# Patient Record
Sex: Female | Born: 1935 | Race: White | Hispanic: No | Smoking: Former smoker
Health system: Southern US, Community
[De-identification: ages and names within clinical notes are randomized; demographics above are authoritative.]

## PROBLEM LIST (undated history)

## (undated) DIAGNOSIS — K219 Gastro-esophageal reflux disease without esophagitis: Secondary | ICD-10-CM

## (undated) DIAGNOSIS — R251 Tremor, unspecified: Secondary | ICD-10-CM

## (undated) DIAGNOSIS — E039 Hypothyroidism, unspecified: Secondary | ICD-10-CM

## (undated) DIAGNOSIS — S82899A Other fracture of unspecified lower leg, initial encounter for closed fracture: Secondary | ICD-10-CM

## (undated) DIAGNOSIS — G4700 Insomnia, unspecified: Secondary | ICD-10-CM

## (undated) DIAGNOSIS — M545 Low back pain, unspecified: Secondary | ICD-10-CM

## (undated) DIAGNOSIS — R269 Unspecified abnormalities of gait and mobility: Secondary | ICD-10-CM

## (undated) DIAGNOSIS — G2581 Restless legs syndrome: Secondary | ICD-10-CM

## (undated) DIAGNOSIS — M1990 Unspecified osteoarthritis, unspecified site: Secondary | ICD-10-CM

## (undated) DIAGNOSIS — N6310 Unspecified lump in the right breast, unspecified quadrant: Secondary | ICD-10-CM

## (undated) DIAGNOSIS — F32A Depression, unspecified: Secondary | ICD-10-CM

## (undated) DIAGNOSIS — D693 Immune thrombocytopenic purpura: Secondary | ICD-10-CM

## (undated) DIAGNOSIS — D492 Neoplasm of unspecified behavior of bone, soft tissue, and skin: Secondary | ICD-10-CM

## (undated) DIAGNOSIS — G8929 Other chronic pain: Secondary | ICD-10-CM

## (undated) DIAGNOSIS — Z9981 Dependence on supplemental oxygen: Secondary | ICD-10-CM

## (undated) DIAGNOSIS — I639 Cerebral infarction, unspecified: Secondary | ICD-10-CM

## (undated) DIAGNOSIS — R61 Generalized hyperhidrosis: Secondary | ICD-10-CM

## (undated) DIAGNOSIS — G3184 Mild cognitive impairment, so stated: Secondary | ICD-10-CM

## (undated) DIAGNOSIS — I10 Essential (primary) hypertension: Secondary | ICD-10-CM

## (undated) DIAGNOSIS — L719 Rosacea, unspecified: Secondary | ICD-10-CM

## (undated) DIAGNOSIS — J449 Chronic obstructive pulmonary disease, unspecified: Secondary | ICD-10-CM

## (undated) DIAGNOSIS — F419 Anxiety disorder, unspecified: Secondary | ICD-10-CM

## (undated) DIAGNOSIS — R42 Dizziness and giddiness: Secondary | ICD-10-CM

## (undated) DIAGNOSIS — F329 Major depressive disorder, single episode, unspecified: Secondary | ICD-10-CM

## (undated) DIAGNOSIS — E785 Hyperlipidemia, unspecified: Secondary | ICD-10-CM

## (undated) DIAGNOSIS — I219 Acute myocardial infarction, unspecified: Secondary | ICD-10-CM

## (undated) DIAGNOSIS — I2510 Atherosclerotic heart disease of native coronary artery without angina pectoris: Secondary | ICD-10-CM

## (undated) DIAGNOSIS — J189 Pneumonia, unspecified organism: Secondary | ICD-10-CM

## (undated) DIAGNOSIS — E538 Deficiency of other specified B group vitamins: Secondary | ICD-10-CM

## (undated) HISTORY — DX: Unspecified osteoarthritis, unspecified site: M19.90

## (undated) HISTORY — DX: Neoplasm of unspecified behavior of bone, soft tissue, and skin: D49.2

## (undated) HISTORY — DX: Generalized hyperhidrosis: R61

## (undated) HISTORY — DX: Chronic obstructive pulmonary disease, unspecified: J44.9

## (undated) HISTORY — DX: Tremor, unspecified: R25.1

## (undated) HISTORY — DX: Major depressive disorder, single episode, unspecified: F32.9

## (undated) HISTORY — DX: Acute myocardial infarction, unspecified: I21.9

## (undated) HISTORY — DX: Dizziness and giddiness: R42

## (undated) HISTORY — DX: Immune thrombocytopenic purpura: D69.3

## (undated) HISTORY — DX: Unspecified lump in the right breast, unspecified quadrant: N63.10

## (undated) HISTORY — DX: Other fracture of unspecified lower leg, initial encounter for closed fracture: S82.899A

## (undated) HISTORY — DX: Rosacea, unspecified: L71.9

## (undated) HISTORY — DX: Cerebral infarction, unspecified: I63.9

## (undated) HISTORY — DX: Essential (primary) hypertension: I10

## (undated) HISTORY — DX: Deficiency of other specified B group vitamins: E53.8

## (undated) HISTORY — DX: Restless legs syndrome: G25.81

## (undated) HISTORY — DX: Hyperlipidemia, unspecified: E78.5

## (undated) HISTORY — PX: SHOULDER OPEN ROTATOR CUFF REPAIR: SHX2407

## (undated) HISTORY — PX: TONSILLECTOMY: SUR1361

## (undated) HISTORY — DX: Gastro-esophageal reflux disease without esophagitis: K21.9

## (undated) HISTORY — DX: Hypothyroidism, unspecified: E03.9

## (undated) HISTORY — DX: Insomnia, unspecified: G47.00

## (undated) HISTORY — DX: Depression, unspecified: F32.A

---

## 1967-08-25 HISTORY — PX: TOTAL ABDOMINAL HYSTERECTOMY: SHX209

## 2003-01-08 ENCOUNTER — Encounter: Payer: Self-pay | Admitting: Orthopaedic Surgery

## 2003-01-08 ENCOUNTER — Encounter: Admission: RE | Admit: 2003-01-08 | Discharge: 2003-01-08 | Payer: Self-pay | Admitting: Orthopaedic Surgery

## 2003-01-11 ENCOUNTER — Ambulatory Visit (HOSPITAL_BASED_OUTPATIENT_CLINIC_OR_DEPARTMENT_OTHER): Admission: RE | Admit: 2003-01-11 | Discharge: 2003-01-12 | Payer: Self-pay | Admitting: Orthopaedic Surgery

## 2003-08-02 ENCOUNTER — Ambulatory Visit (HOSPITAL_BASED_OUTPATIENT_CLINIC_OR_DEPARTMENT_OTHER): Admission: RE | Admit: 2003-08-02 | Discharge: 2003-08-02 | Payer: Self-pay | Admitting: Orthopaedic Surgery

## 2005-01-30 ENCOUNTER — Encounter: Admission: RE | Admit: 2005-01-30 | Discharge: 2005-01-30 | Payer: Self-pay | Admitting: Unknown Physician Specialty

## 2005-08-27 ENCOUNTER — Ambulatory Visit: Payer: Self-pay | Admitting: Cardiology

## 2005-09-30 ENCOUNTER — Ambulatory Visit: Payer: Self-pay | Admitting: Cardiology

## 2006-01-28 ENCOUNTER — Encounter: Admission: RE | Admit: 2006-01-28 | Discharge: 2006-01-28 | Payer: Self-pay | Admitting: Orthopaedic Surgery

## 2006-02-17 ENCOUNTER — Encounter: Admission: RE | Admit: 2006-02-17 | Discharge: 2006-02-17 | Payer: Self-pay | Admitting: Orthopaedic Surgery

## 2006-03-11 ENCOUNTER — Encounter: Admission: RE | Admit: 2006-03-11 | Discharge: 2006-03-11 | Payer: Self-pay | Admitting: Orthopaedic Surgery

## 2006-06-02 ENCOUNTER — Encounter: Admission: RE | Admit: 2006-06-02 | Discharge: 2006-06-02 | Payer: Self-pay | Admitting: Orthopaedic Surgery

## 2006-09-06 ENCOUNTER — Encounter: Admission: RE | Admit: 2006-09-06 | Discharge: 2006-09-06 | Payer: Self-pay | Admitting: Orthopaedic Surgery

## 2006-09-20 ENCOUNTER — Encounter: Admission: RE | Admit: 2006-09-20 | Discharge: 2006-09-20 | Payer: Self-pay | Admitting: Orthopaedic Surgery

## 2006-10-15 ENCOUNTER — Ambulatory Visit: Payer: Self-pay | Admitting: Cardiology

## 2007-02-16 ENCOUNTER — Encounter: Admission: RE | Admit: 2007-02-16 | Discharge: 2007-02-16 | Payer: Self-pay | Admitting: Orthopaedic Surgery

## 2007-04-08 ENCOUNTER — Encounter: Admission: RE | Admit: 2007-04-08 | Discharge: 2007-04-08 | Payer: Self-pay | Admitting: Orthopaedic Surgery

## 2007-04-26 ENCOUNTER — Encounter: Admission: RE | Admit: 2007-04-26 | Discharge: 2007-04-26 | Payer: Self-pay | Admitting: Orthopaedic Surgery

## 2007-06-10 ENCOUNTER — Encounter: Admission: RE | Admit: 2007-06-10 | Discharge: 2007-06-10 | Payer: Self-pay | Admitting: Orthopaedic Surgery

## 2008-09-20 ENCOUNTER — Ambulatory Visit (HOSPITAL_BASED_OUTPATIENT_CLINIC_OR_DEPARTMENT_OTHER): Admission: RE | Admit: 2008-09-20 | Discharge: 2008-09-21 | Payer: Self-pay | Admitting: Orthopaedic Surgery

## 2010-12-08 LAB — POCT I-STAT, CHEM 8
BUN: 29 mg/dL — ABNORMAL HIGH (ref 6–23)
HCT: 37 % (ref 36.0–46.0)
Hemoglobin: 12.6 g/dL (ref 12.0–15.0)
Potassium: 4.2 mEq/L (ref 3.5–5.1)
TCO2: 26 mmol/L (ref 0–100)

## 2011-01-06 NOTE — Op Note (Signed)
NAMEMADGE, THERRIEN             ACCOUNT NO.:  [id number]   MEDICAL RECORD NO.:  [id number]          PATIENT TYPE:  AMB   LOCATION:  DSC                          FACILITY:  [facility]   PHYSICIAN:  Claude Manges. Whitfield, M.D.DATE OF BIRTH:  03/06/1925   DATE OF PROCEDURE:  DATE OF DISCHARGE:                               OPERATIVE REPORT   PREOPERATIVE DIAGNOSES:  1. Rotator cuff tear with impingement, left shoulder.  2. Degenerative joint disease, acromioclavicular joint.   POSTOPERATIVE DIAGNOSES:  1. Rotator cuff tear with impingement left shoulder.  2. Degenerative joint disease, acromioclavicular joint.  3. Biceps tendon tear.   PROCEDURES:  1. Diagnostic arthroscopy, left shoulder with debridement.  2. Arthroscopic subacromial decompression.  3. Arthroscopic distal clavicle resection.  4. Mini-open rotator cuff tear repair with biceps tenodesis.   SURGEON:  Claude Manges. Cleophas Dunker, MD   ASSISTANT:  Karie Chimera, PA-C   ANESTHESIA:  General with supplemental interscalene nerve block.   COMPLICATIONS:  None.   HISTORY:  An 77 year old female who has been seen at intervals in  regards to left shoulder pain.  She has had trouble for well over a year  particularly with overhead motion.  She has recently developed more pain  to that point of compromise with difficulty sleeping.  She has had an  MRI scan performed a year and half ago revealing moderate degenerative  change at the [facility] joint, areas of tearing of the supraspinatus tendon and  now she is to have an arthroscopic evaluation with rotator cuff tear  repair.   PROCEDURE:  With the patient comfortable on operating table and under  general orotracheal anesthesia, the patient was placed in semi-sitting  position with a shoulder frame.  She had a preoperative interscalene  nerve block with excellent anesthesia to her arm.   The left shoulder was then evaluated without evidence of adhesive  capsulitis or subluxation.   The  left shoulder was then prepped with DuraPrep and the base of the  neck circumferentially, and below the elbow, a sterile draping was  performed.   Marking pen was used to outline the [facility] joint, the coracoid, and the  acromion.  At a point, a fingerbreadth posterior and medial to the  posterior angle of acromion, a small stab wound was made.  The  arthroscope easily placed in the shoulder joint.  Diagnostic arthroscopy  revealed diffuse synovitis.  There was an obvious tear of the biceps  tendon about a cm from its attachment of the superior glenoid.  There  was no subluxation.  The tear was over 50% of the tendon.  A second  portal was established anteriorly and the cannula inserted.  Debridement  of the synovitis was performed.  I did find a small tear of the glenoid  labrum, which I debrided and resected the synovitis.   The arthroscope was then placed at subacromial space posteriorly, the  cannula at subacromial space anteriorly and a third portal was  established on lateral subacromial space.  An arthroscopic subacromial  decompression was performed, removing abundant bursal tissue.  There was  obvious overhang of the anterior  and lateral acromion.  An anterolateral  acromioplasty was performed with a 6 mm bur, had a very nice resection.  The distal clavicle was visible.  There was loss of articular cartilage  and was quite mobile.  I performed the distal clavicle resection with  the 6 mm bur.  I could visualize the rotator cuff tear both from bursal  and the joint surface.   A mini-open rotator cuff tear repair was performed about 1 to 1-1/2 inch  incision was made over the anterior aspect of the shoulder while sharp  dissection carried out at the subcutaneous tissue, gross bleeders bovie  coagulated.  Deltoid fascia was then incised and the subacromial space  entered.  A self-retaining retractor was inserted.  Rotator cuff was  evaluated, there was a longitudinal tear in the  supraspinatus just  medial to the biceps tendon, but there was no retraction of the cuff.  The edges were sharply debrided.  The biceps tendon was identified, it  was tagged and then incised from its attachment to the superior aspect  of the glenoid.  Biceps tenodesis was performed using the 5.5 mm  [employer] anchor within its groove.  Extraneous tendon was then  excised.   The cuff tear was then easily repaired from its superior aspect through  the biceps canal with interrupted 0 Ethibond, had a very nice repair.  The wound was irrigated with saline solution.  The deltoid fascia closed  with a running 0 Vicryl, subcu with 2-0 Vicryl.  Skin closed with the  Steri-Strips and with benzoin.  A sterile bulky dressing was applied  followed by a sling.   PLAN:  Recovery care, Vicodin for pain, office in 1 week.      Claude Manges. Cleophas Dunker, M.D.  Electronically Signed     PWW/MEDQ  D:  09/20/2008  T:  09/21/2008  Job:  [id number]

## 2011-01-09 NOTE — Op Note (Signed)
Tamara Hart, Tamara Hart                         ACCOUNT NO.:  [id number]   MEDICAL RECORD NO.:  [id number]                   PATIENT TYPE:  AMB   LOCATION:  DSC                                  FACILITY:  [facility]   PHYSICIAN:  Claude Manges. Cleophas Dunker, M.D.            DATE OF BIRTH:  August 06, 1925   DATE OF PROCEDURE:  08/02/2003  DATE OF DISCHARGE:                                 OPERATIVE REPORT   PREOPERATIVE DIAGNOSIS:  Recurrent rotator cuff tear, right shoulder.   POSTOPERATIVE DIAGNOSIS:  Recurrent rotator cuff tear, right shoulder.   PROCEDURE:  Rotator cuff tear repair with supplemental Dupuy Restore patch.   SURGEON:  Claude Manges. Cleophas Dunker, M.D.   ASSISTANT:  Legrand Pitts. Duffy, P.A.   ANESTHESIA:  General orotracheal with supplemental interscalene block.   COMPLICATIONS:  None.   INDICATIONS FOR PROCEDURE:  This 77 year old female is probably seven or  eight months post rotator cuff tear repair with a distal clavicle resection  and open acromioplasty and she did very well initially only to have a fall  approximately 6 to 7 weeks postoperatively.  She has some trouble every  since that time, seems to have progressed, and accordingly a second MRI scan  was performed revealing a recurrent tear of the supraspinatus.  She is  uncomfortable and therefore, she will have an exploration of the shoulder  and then repair of the recurrent tear.  There is no evidence of impingement.   DESCRIPTION OF PROCEDURE:  With the patient comfortable on the operating  table and under general orotracheal anesthesia with a supplemental  interscalene block, the patient was placed in a semisitting position with  the shoulder frame. The right shoulder was then prepped in the base of the  neck circumferentially to below the elbow.  Sterile draping was performed.   The previous mini incision at the junction of the anterior and lateral  aspects of the shoulder was utilized.  The 1-1/2 inch incision was incised  sharply through fascia.  Deltoid fascia was identified and incised the  raphe.  By blunt dissection, the deltoid muscle was separated.  Self-  retaining retractor was inserted.  Subdeltoid bursa was identified and  excised.  The rotator cuff and the subacromial space was then easily  identified.  There was a tear over the rotator cuff.  It was longitudinal in  type, extending from the attachment to the greater tuberosity, almost to the  glenoid rim superiorly.  The edges were rounded and it was sharply debrided.  The biceps tendon was intact.  The rotator cuff was diffusely atrophic.  I  was able to repair the edges with 0 Tycron suture after sharply debriding  them.  I then felt that a Restore patch was worthwhile just to supplement  the repair and hopefully prevent further tearing.  Accordingly, the patch  was soaked in sterile saline for 10 minutes and then repaired with 2-0  Ethibond under  tension.   The wound was then copiously irrigated with saline solution.  There was no  evidence of any impingement of the acromial edge either laterally or  anteriorly.   The wound was again irrigated with saline solution and the deltoid fascia  closed with a running 2-0 Vicryl, the subcu closed with 2-0 Vicryl, the skin  was closed with skin clips.  A sterile bulky dressing was applied followed  by a sling.   PLAN:  Recovery care.  Mepergan Fortis for pain.  Office one week.                                               Claude Manges. Cleophas Dunker, M.D.    PWW/MEDQ  D:  08/02/2003  T:  08/03/2003  Job:  [id number]

## 2011-01-09 NOTE — Op Note (Signed)
Tamara Hart, Tamara Hart                         ACCOUNT NO.:  [id number]   MEDICAL RECORD NO.:  [id number]                   PATIENT TYPE:  AMB   LOCATION:  DSC                                  FACILITY:  [facility]   PHYSICIAN:  Claude Manges. Cleophas Dunker, M.D.            DATE OF BIRTH:  12/27/1924   DATE OF PROCEDURE:  DATE OF DISCHARGE:                                 OPERATIVE REPORT   PREOPERATIVE DIAGNOSES:  1. Rotator cuff tear with impingement of the right shoulder.  2. Degenerative joint disease, acromioclavicular joint.   POSTOPERATIVE DIAGNOSES:  1. Rotator cuff tear with impingement of the right shoulder.  2. Degenerative joint disease, acromioclavicular joint.   PROCEDURE:  1. Arthroscopic subacromial decompression.  2. Arthroscopic-assisted clavicle resection.  3. Mini open rotator cuff tear repair.   SURGEON:  Claude Manges. Cleophas Dunker, M.D.   ASSISTANT:  Trina Ao, P.A.-C.   ANESTHESIA:  General tracheal.   COMPLICATIONS:  None.   HISTORY:  The patient is a 77 year old female who sustained a rotator cuff  tear in early January of this year when she was simply using my shoulder.  She had seen other orthopedists in [place], with an MRI scan consistent with  rotator cuff tear.  She came to [facility] for reevaluation with little if  any discomfort in early April.  The surgery was postponed, but because of  her persistent discomfort over the past several weeks, she preferred to  proceed with the surgery.  She was found to have rotator cuff tear repair  with acromioplasty and distal clavicle resection.   PROCEDURE:  The patient was comfortable on the operating table.  Under  general endotracheal anesthesia and supplemental under scalene block, the  patient was placed in a semi-sitting position, with a shoulder __________ .  The right shoulder was then prepped circumferentially from the base of the  neck to the mid forearm with DuraPrep, and sterile draping was performed.   A  marking pen was used to outline the acromion, the [facility] joint, and the  coracoid.  At a point, a fingerbreadth inferior and medial to the posterior  angle of the acromion, a small stable wound was made.  Arthroscope was  easily placed into the joint.  I did not see any appreciable chondromalacia  of the humeral head or the glenoid.  I did not see any tearing of the  labrum.  The biceps was intact.  There was an obvious old tear of the  rotator cuff with retraction and rounded edges.   The arthroscope was then placed in the subacromial space.  A second port  hole was established anteriorly, and a third in the lateral subacromial  space, and arthroscopic subacromial decompression was performed with the  ArthroCare wand, the intraarticular shaver, and the 6 mm bur with a very  nice decompression anteriorly and laterally as there were overhanging  osteophytes.   A distal clavicle resection was  also performed through the scope.   A mini-open procedure was then performed through the rotator cuff tear.  About 1-1/2 inch incision was made at the junction of the anterior and  lateral aspects of the shoulder, and very sharp dissection was carried down  to the subcutaneous tissues, involving the use of Bovie coagulating.  __________  within the deltoid muscle was identified, incised, and then  retracted to visualize the rotator cuff.  The edges were rounded and  retracted.  These were retrieved.  The edges were sharply debrided, and the  cuff was repaired end-to-end beginning posteriorly, in a V-fashion extending  to the anterior aspect of the shoulder.  Anchoring was not necessary.   We had a nice repair of the cuff.  We then placed into a range of motion,  and it remained stable with good coverage.   The joint was irrigated.  The deltoid muscle was closed anatomically with a  running 0-Vicryl, and the subcutaneous with a 2-0 Vicryl. The skin was  closed with skin clips.  Sterile, bulky dressing  was applied, followed by a  sling.   PLAN:  1. Recovery care.  2. Discharge in the a.m. with Percocet.  3. Return to office in one week.                                               Claude Manges. Cleophas Dunker, M.D.    PWW/MEDQ  D:  01/11/2003  T:  01/11/2003  Job:  [id number]

## 2011-04-07 ENCOUNTER — Encounter: Payer: Self-pay | Admitting: *Deleted

## 2011-04-07 ENCOUNTER — Ambulatory Visit (INDEPENDENT_AMBULATORY_CARE_PROVIDER_SITE_OTHER): Payer: Medicare Other | Admitting: Pulmonary Disease

## 2011-04-07 VITALS — BP 132/74 | HR 60 | Temp 98.6°F | Ht 61.0 in | Wt 126.8 lb

## 2011-04-07 DIAGNOSIS — R0600 Dyspnea, unspecified: Secondary | ICD-10-CM | POA: Insufficient documentation

## 2011-04-07 DIAGNOSIS — R0609 Other forms of dyspnea: Secondary | ICD-10-CM

## 2011-04-07 MED ORDER — TIOTROPIUM BROMIDE MONOHYDRATE 18 MCG IN CAPS: 18.0000 ug | ORAL_CAPSULE | Freq: Every day | RESPIRATORY_TRACT | Status: DC

## 2011-04-07 NOTE — Patient Instructions (Signed)
Spiriva one puff daily Continue advair one puff twice per day Follow up in 4 weeks

## 2011-04-07 NOTE — Progress Notes (Signed)
Subjective:    Patient ID: Tamara Hart, female    DOB: [birth year], 77 y.o.   MRN: [id number]  HPI  77 yo female former smoker with dyspnea.  She has noticed more trouble with her breathing for the past 8 months.  She used to smoke, and has significant 2nd hand tobacco exposure from her husband.  She was told that she had emphysema after a breathing test one year ago.  She has been getting winded when talking or with minimal activity.  She also has been doing purse-lip breathing with activity.  She is not aware of anything that could have triggered her worsening dyspnea.    She gets a dry cough.  She denies wheeze, chest tightness, chest pain, hemoptysis.  She has not had sinus problems or post-nasal drip.  She is not aware of having allergies.  She does get a raspy throat and has been sneezing in the morning.  She sometimes feels like something is stuck in her throat.  She was started recently on advair, and this has helped.    The patient denies any history of pneumonia or tuberculosis.  There is no recent travel history.  The patient denies any significant occupational exposures.  The patient denies any animal exposures.  Past Medical History  Diagnosis Date  . COPD (chronic obstructive pulmonary disease)   . DJD (degenerative joint disease)   . Mass of breast, right   . Skin neoplasm   . Hypertension   . Vertigo   . Restless legs   . Hypothyroidism   . Shortness of breath   . Esophageal reflux   . Hyperlipidemia   . Hyperhidrosis   . Insomnia   . Depressive disorder   . Pure hypercholesterolemia   . Vitamin B 12 deficiency   . Allergic rhinitis   . Stroke   . Idiopathic thrombocytopenia purpura   . Rosacea      Family History  Problem Relation Age of Onset  . Heart disease Mother   . Heart disease Father      History   Social History  . Marital Status: Married    Spouse Name: N/A    Number of Children: N/A  . Years of Education: N/A   Occupational  History  . retired    Social History Main Topics  . Smoking status: Former Smoker -- 1.0 packs/day for 3 years    Quit date: 08/24/1996  . Smokeless tobacco: Not on file  . Alcohol Use: Not on file  . Drug Use: Not on file  . Sexually Active: Not on file   Other Topics Concern  . Not on file   Social History Narrative  . No narrative on file     Allergies  Allergen Reactions  . Codeine     Review of Systems  HENT: Positive for trouble swallowing.   Respiratory: Positive for cough and shortness of breath.   Musculoskeletal: Positive for joint swelling.      Objective:   Physical Exam BP 132/74  Pulse 60  Temp(Src) 98.6 F (37 C) (Oral)  Ht 5\' 1"  (1.549 m)  Wt 126 lb 12.8 oz (57.516 kg)  BMI 23.96 kg/m2  SpO2 97%  General - thin HEENT - PERRLA, EOMI, no sinus tenderness, no oral exudate, no LAN, no thyromegaly Chest - diminished breath sounds, no wheeze/rales/dullness Cardiac - s1s2 regular, no murmur, pulses symmetric Abd - soft, nontender, no organomegaly, normal bowel sounds Ext - no e/c/c Neuro - normal strenght, CN  intact Psych - normal mood/behavior Skin - no rashes  Chest xray 03/12/11>>no active disease process.  Spirometry attempted, but she had difficulty with test maneuver.     Assessment & Plan:   Dyspnea She has extensive prior history of smoking.  PFT from 2011 showed diffusion defect.  Her symptoms are suggestive of obstructive lung disease.  She had difficulty doing spirometry today.  She has noticed symptomatic improvement with inhaler therapy.  I will continue her with advair, and add spiriva one puff daily.  Depending on her response will determine if additional interventions are needed.    Updated Medication List Outpatient Encounter Prescriptions as of 04/07/2011  Medication Sig Dispense Refill  . albuterol (VENTOLIN HFA) 108 (90 BASE) MCG/ACT inhaler Inhale 2 puffs into the lungs every 6 (six) hours as needed.        . ALPRAZolam  (XANAX) 0.5 MG tablet Take 0.5 mg by mouth 2 (two) times daily as needed.        Marland Kitchen aspirin 325 MG tablet Take 325 mg by mouth daily.        . Calcium Carb-Cholecalciferol (CALCIUM 500 +D) 500-400 MG-UNIT TABS 1 tablet twice a day       . carbidopa-levodopa (SINEMET) 25-100 MG per tablet Take 1 tablet by mouth 2 (two) times daily.        . citalopram (CELEXA) 20 MG tablet Take 20 mg by mouth daily.        . diazepam (VALIUM) 2 MG tablet Take 2 mg by mouth every 6 (six) hours as needed.        . fluticasone-salmeterol (ADVAIR HFA) 115-21 MCG/ACT inhaler Inhale 2 puffs into the lungs 2 (two) times daily.        Marland Kitchen glycopyrrolate (ROBINUL) 1 MG tablet Take 1 mg by mouth 3 (three) times daily.        Marland Kitchen HYDROcodone-acetaminophen (LORTAB) 10-500 MG per tablet Take 1 tablet by mouth every 6 (six) hours as needed.        . labetalol (NORMODYNE) 300 MG tablet Take 300 mg by mouth 2 (two) times daily.        Marland Kitchen levothyroxine (SYNTHROID, LEVOTHROID) 100 MCG tablet Take 100 mcg by mouth daily.        . meclizine (ANTIVERT) 25 MG tablet Take 12.5 mg by mouth 2 (two) times daily as needed.        . meloxicam (MOBIC) 7.5 MG tablet Take 7.5 mg by mouth 2 (two) times daily.        Marland Kitchen omeprazole (PRILOSEC) 20 MG capsule Take 20 mg by mouth daily.        Marland Kitchen oxybutynin (DITROPAN-XL) 10 MG 24 hr tablet Take 10 mg by mouth daily.        . pantoprazole (PROTONIX) 40 MG tablet Take 40 mg by mouth daily.        Marland Kitchen zolpidem (AMBIEN) 5 MG tablet Take 5 mg by mouth at bedtime as needed.        . tiotropium (SPIRIVA HANDIHALER) 18 MCG inhalation capsule Place 1 capsule (18 mcg total) into inhaler and inhale daily.  30 capsule  5

## 2011-04-09 NOTE — Assessment & Plan Note (Signed)
She has extensive prior history of smoking.  PFT from 2011 showed diffusion defect.  Her symptoms are suggestive of obstructive lung disease.  She had difficulty doing spirometry today.  She has noticed symptomatic improvement with inhaler therapy.  I will continue her with advair, and add spiriva one puff daily.  Depending on her response will determine if additional interventions are needed.

## 2011-05-14 ENCOUNTER — Ambulatory Visit (INDEPENDENT_AMBULATORY_CARE_PROVIDER_SITE_OTHER): Payer: Medicare Other | Admitting: Pulmonary Disease

## 2011-05-14 ENCOUNTER — Encounter: Payer: Self-pay | Admitting: Pulmonary Disease

## 2011-05-14 VITALS — BP 120/64 | HR 58 | Temp 98.3°F | Ht 61.0 in | Wt 134.6 lb

## 2011-05-14 DIAGNOSIS — R0600 Dyspnea, unspecified: Secondary | ICD-10-CM

## 2011-05-14 DIAGNOSIS — R0989 Other specified symptoms and signs involving the circulatory and respiratory systems: Secondary | ICD-10-CM

## 2011-05-14 MED ORDER — TIOTROPIUM BROMIDE MONOHYDRATE 18 MCG IN CAPS: ORAL_CAPSULE | RESPIRATORY_TRACT | Status: DC

## 2011-05-14 NOTE — Patient Instructions (Addendum)
Try using spiriva one puff every other day Follow up in 6 months

## 2011-05-14 NOTE — Assessment & Plan Note (Signed)
She has extensive prior history of smoking.  PFT from 2011 showed diffusion defect.  Her symptoms are suggestive of obstructive lung disease.    She has improvement after addition of spiriva to advair.  Advised her to continue with advair, but try using spiriva every other day.  She is to call if her breathing gets worse.  She already had her flu shot this season.

## 2011-05-14 NOTE — Progress Notes (Signed)
Subjective:    Patient ID: Tamara Hart, female    DOB: 04/04/1925, 77 y.o.   MRN: [id number]  HPI  77 yo female former smoker with dyspnea likely from obstructive lung disease.  She has been doing better since starting spiriva.  She feels like she can do more activity (she was cutting wood last weekend!).  She is not having much cough, wheeze, or sputum.  She does not have sinus congestion.  She has chronic hoarseness, but she has undergone an extensive evaluation in [place] from ENT and was told she has a benign lesion and nothing to do for this.  Past Medical History  Diagnosis Date  . COPD (chronic obstructive pulmonary disease)   . DJD (degenerative joint disease)   . Mass of breast, right   . Skin neoplasm   . Hypertension   . Vertigo   . Restless legs   . Hypothyroidism   . Shortness of breath   . Esophageal reflux   . Hyperlipidemia   . Hyperhidrosis   . Insomnia   . Depressive disorder   . Pure hypercholesterolemia   . Vitamin B 12 deficiency   . Allergic rhinitis   . Stroke   . Idiopathic thrombocytopenia purpura   . Rosacea     Family History  Problem Relation Age of Onset  . Heart disease Mother   . Heart disease Father     History   Social History  . Marital Status: Married   Occupational History  . retired    Social History Main Topics  . Smoking status: Former Smoker -- 1.0 packs/day for 3 years    Quit date: 08/24/1996   Allergies  Allergen Reactions  . Codeine     Review of Systems     Objective:   Physical Exam  BP 120/64  Pulse 58  Temp(Src) 98.3 F (36.8 C) (Oral)  Ht 5\' 1"  (1.549 m)  Wt 134 lb 9.6 oz (61.054 kg)  BMI 25.43 kg/m2  SpO2 98%  General - thin  HEENT - PERRLA, EOMI, no sinus tenderness, no oral exudate, no LAN, no thyromegaly  Chest - diminished breath sounds, no wheeze/rales/dullness  Cardiac - s1s2 regular, no murmur, pulses symmetric  Abd - soft, nontender, no organomegaly, normal bowel sounds  Ext -  no e/c/c  Neuro - normal strenght, CN intact  Psych - normal mood/behavior  Skin - no rashes     Assessment & Plan:   Dyspnea She has extensive prior history of smoking.  PFT from 2011 showed diffusion defect.  Her symptoms are suggestive of obstructive lung disease.    She has improvement after addition of spiriva to advair.  Advised her to continue with advair, but try using spiriva every other day.  She is to call if her breathing gets worse.  She already had her flu shot this season.      Updated Medication List Outpatient Encounter Prescriptions as of 05/14/2011  Medication Sig Dispense Refill  . albuterol (VENTOLIN HFA) 108 (90 BASE) MCG/ACT inhaler Inhale 2 puffs into the lungs every 6 (six) hours as needed.        . ALPRAZolam (XANAX) 0.5 MG tablet Take 0.5 mg by mouth 2 (two) times daily as needed.        Marland Kitchen aspirin 325 MG tablet Take 325 mg by mouth daily.        . Calcium Carb-Cholecalciferol (CALCIUM 500 +D) 500-400 MG-UNIT TABS 1 tablet twice a day       .  carbidopa-levodopa (SINEMET) 25-100 MG per tablet Take 1 tablet by mouth 2 (two) times daily.        . citalopram (CELEXA) 20 MG tablet Take 20 mg by mouth daily.        . diazepam (VALIUM) 2 MG tablet Take 2 mg by mouth every 6 (six) hours as needed.        . fluticasone-salmeterol (ADVAIR HFA) 115-21 MCG/ACT inhaler Inhale 2 puffs into the lungs 2 (two) times daily.        Marland Kitchen glycopyrrolate (ROBINUL) 1 MG tablet Take 1 mg by mouth 3 (three) times daily.        Marland Kitchen HYDROcodone-acetaminophen (LORTAB) 10-500 MG per tablet Take 1 tablet by mouth every 6 (six) hours as needed.        . labetalol (NORMODYNE) 300 MG tablet Take 300 mg by mouth 2 (two) times daily.        Marland Kitchen levothyroxine (SYNTHROID, LEVOTHROID) 100 MCG tablet Take 100 mcg by mouth daily.        . meclizine (ANTIVERT) 25 MG tablet Take 12.5 mg by mouth 2 (two) times daily as needed.        . meloxicam (MOBIC) 7.5 MG tablet Take 7.5 mg by mouth 2 (two) times daily.         Marland Kitchen omeprazole (PRILOSEC) 20 MG capsule Take 20 mg by mouth daily.        Marland Kitchen oxybutynin (DITROPAN-XL) 10 MG 24 hr tablet Take 10 mg by mouth daily.        . pantoprazole (PROTONIX) 40 MG tablet Take 40 mg by mouth daily.        Marland Kitchen tiotropium (SPIRIVA HANDIHALER) 18 MCG inhalation capsule One puff every other day  30 capsule  5  . zolpidem (AMBIEN) 5 MG tablet Take 5 mg by mouth at bedtime as needed.        Marland Kitchen DISCONTD: tiotropium (SPIRIVA HANDIHALER) 18 MCG inhalation capsule Place 1 capsule (18 mcg total) into inhaler and inhale daily.  30 capsule  5

## 2013-06-22 ENCOUNTER — Encounter (INDEPENDENT_AMBULATORY_CARE_PROVIDER_SITE_OTHER): Payer: Self-pay | Admitting: *Deleted

## 2014-05-21 ENCOUNTER — Encounter: Payer: Self-pay | Admitting: Neurology

## 2014-05-21 ENCOUNTER — Ambulatory Visit (INDEPENDENT_AMBULATORY_CARE_PROVIDER_SITE_OTHER): Payer: Medicare Other | Admitting: Neurology

## 2014-05-21 VITALS — BP 130/78 | HR 61 | Wt 131.4 lb

## 2014-05-21 DIAGNOSIS — R51 Headache: Secondary | ICD-10-CM

## 2014-05-21 DIAGNOSIS — R519 Headache, unspecified: Secondary | ICD-10-CM

## 2014-05-21 DIAGNOSIS — G4486 Cervicogenic headache: Secondary | ICD-10-CM

## 2014-05-21 NOTE — Patient Instructions (Signed)
Since the headache has resolved, we don't need to start any medications or perform other tests.  If it comes back, please call and I will see you again.  Don't take the hydrocodone for headache.  Instead, you should try tylenol, ibuprofen or naproxen.  You should never take pain relievers more than 2 days out of the week to prevent rebound headache.

## 2014-05-21 NOTE — Progress Notes (Signed)
NEUROLOGY CONSULTATION NOTE  Tamara Hart MRN: [id number] DOB: 06-Aug-1925  Referring provider: Dr. Woody Seller Primary care provider: Dr. Woody Seller  Reason for consult:  Headache  HISTORY OF PRESENT ILLNESS: Tamara Hart is an 80 year old right-handed woman hypertension, stroke, restless leg, COPD, hyperlipidemia, depression and COPD who presents for headache.  About one month ago, she developed a left-sided headache.  It started in the left side of the suboccipital region and radiated up to the front of the head.  It was a throbbing pain.  It would come and go, occuring daily, sometimes several times a day.  There was no associated visual changes, nausea, vomiting, or dizziness.  She did not have any head trauma or change in medications.  She does say that her vision has gradually gotten worse over the past year and that she needs new glasses.  She has an eye appointment next week.  Earlier in the summer, she had pneumonia.  She took hydrocodone, which was ineffective.  She had an MRI of the brain without contrast performed on 05/10/14, which showed "prominent small vessel disease", "remote small posterior left frontal lobe infarct" and "global age related atrophy without hydrocephalus" but no acute infarct or mass lesions.  However, the headache resolved about 2 weeks ago.  She is feeling well now.  She has no prior history of headache.  She denied diffuse joint pain or fever.  Incidentally, she broke her right ankle after a fall.  She takes carbidopa-levodopa 25-100mg  for restless leg, meclizine for dizziness, citalopram 20mg  for depression and trazodone to help sleep.  She has had side effects with venlafaxine.  PAST MEDICAL HISTORY: Past Medical History  Diagnosis Date  . COPD (chronic obstructive pulmonary disease)   . DJD (degenerative joint disease)   . Mass of breast, right   . Skin neoplasm   . Hypertension   . Vertigo   . Restless legs   . Hypothyroidism   . Shortness of  breath   . Esophageal reflux   . Hyperlipidemia   . Hyperhidrosis   . Insomnia   . Depressive disorder   . Pure hypercholesterolemia   . Vitamin B 12 deficiency   . Allergic rhinitis   . Stroke   . Idiopathic thrombocytopenia purpura   . Rosacea   . Broken ankle     PAST SURGICAL HISTORY: Past Surgical History  Procedure Laterality Date  . Right shoulder surgery      x3--rotator cuff  . Total abdominal hysterectomy      MEDICATIONS: Current Outpatient Prescriptions on File Prior to Visit  Medication Sig Dispense Refill  . albuterol (VENTOLIN HFA) 108 (90 BASE) MCG/ACT inhaler Inhale 2 puffs into the lungs every 6 (six) hours as needed.        . ALPRAZolam (XANAX) 0.5 MG tablet Take 0.5 mg by mouth 2 (two) times daily as needed.        . Calcium Carb-Cholecalciferol (CALCIUM 500 +D) 500-400 MG-UNIT TABS 1 tablet twice a day       . carbidopa-levodopa (SINEMET) 25-100 MG per tablet Take 1 tablet by mouth 2 (two) times daily.        . fluticasone-salmeterol (ADVAIR HFA) 115-21 MCG/ACT inhaler Inhale 2 puffs into the lungs 2 (two) times daily.        Marland Kitchen glycopyrrolate (ROBINUL) 1 MG tablet Take 1 mg by mouth 3 (three) times daily.        Marland Kitchen HYDROcodone-acetaminophen (LORTAB) 10-500 MG per tablet Take 1  tablet by mouth every 6 (six) hours as needed.       . labetalol (NORMODYNE) 300 MG tablet Take 300 mg by mouth 2 (two) times daily.        Marland Kitchen levothyroxine (SYNTHROID, LEVOTHROID) 100 MCG tablet Take 100 mcg by mouth daily.        . meclizine (ANTIVERT) 25 MG tablet Take 12.5 mg by mouth 2 (two) times daily as needed.        Marland Kitchen omeprazole (PRILOSEC) 20 MG capsule Take 20 mg by mouth daily.        . citalopram (CELEXA) 20 MG tablet Take 20 mg by mouth daily.        Marland Kitchen tiotropium (SPIRIVA HANDIHALER) 18 MCG inhalation capsule One puff every other day  30 capsule  5   No current facility-administered medications on file prior to visit.    ALLERGIES: Allergies  Allergen Reactions  .  Codeine     FAMILY HISTORY: Family History  Problem Relation Age of Onset  . Heart disease Mother   . Heart disease Father     SOCIAL HISTORY: History   Social History  . Marital Status: Married    Spouse Name: N/A    Number of Children: N/A  . Years of Education: N/A   Occupational History  . retired    Social History Main Topics  . Smoking status: Former Smoker -- 1.00 packs/day for 3 years    Quit date: 08/24/1996  . Smokeless tobacco: Not on file  . Alcohol Use: Not on file  . Drug Use: Not on file  . Sexual Activity: Not on file   Other Topics Concern  . Not on file   Social History Narrative  . No narrative on file    REVIEW OF SYSTEMS: Constitutional: No fevers, chills, or sweats, no generalized fatigue, change in appetite Eyes: poor vision Ear, nose and throat: No hearing loss, ear pain, nasal congestion, sore throat Cardiovascular: No chest pain, palpitations Respiratory:  No shortness of breath at rest or with exertion, wheezes GastrointestinaI: No nausea, vomiting, diarrhea, abdominal pain, fecal incontinence Genitourinary:  No dysuria, urinary retention or frequency Musculoskeletal:  Right ankle pain Integumentary: Abrasion on left shin Neurological: as above Psychiatric: No depression, insomnia, anxiety Endocrine: No palpitations, fatigue, diaphoresis, mood swings, change in appetite, change in weight, increased thirst Hematologic/Lymphatic:  No anemia, purpura, petechiae. Allergic/Immunologic: no itchy/runny eyes, nasal congestion, recent allergic reactions, rashes  PHYSICAL EXAM: Filed Vitals:   05/21/14 1311  BP: 130/78  Pulse: 61   General: No acute distress Head:  Normocephalic/atraumatic.  Mild left suboccipital tenderness to palpation.  No temporal tenderness.  Temporal artery not palpated. Neck: supple, no paraspinal tenderness, full range of motion Back: No paraspinal tenderness Heart: regular rate and rhythm Lungs: Clear to  auscultation bilaterally. Vascular: No carotid bruits. Neurological Exam: Mental status: alert and oriented to person, place, and time, recent and remote memory intact, fund of knowledge intact, attention and concentration intact, speech fluent and not dysarthric, language intact. Cranial nerves: CN I: not tested CN II: pupils equal, round and reactive to light, visual fields intact, fundi unremarkable, without vessel changes, exudates, hemorrhages or papilledema. CN III, IV, VI:  full range of motion, no nystagmus, no ptosis CN V: facial sensation intact CN VII: upper and lower face symmetric CN VIII: hearing intact CN IX, X: gag intact, uvula midline CN XI: sternocleidomastoid and trapezius muscles intact CN XII: tongue midline Bulk & Tone: normal, no fasciculations. Motor: 5/5  throughout Sensation: temperature and vibration intact. Deep Tendon Reflexes: 2+ throughout except absent in the left ankle (could not test right ankle due to cast) Finger to nose testing: mild postural and kinetic tremor. Gait: Cautious due to wearing cast.  No ataxia. Romberg negative.  IMPRESSION: Left unilateral headache, possibly cervicogenic.  If the headache has resolved, it is less likely temporal arteritis, so I don't think we need to check inflammatory markers.  PLAN: 1.  She will contact us if headache recurs 2.  Advised not to use hydrocodone to treat headache.  Instead, should use tylenol or NSAID (limited to no more than 2 days out of the week to prevent rebound headache) 3.  Follow up as needed.  45 minutes spent with patient, over 50% spent discussing possible etiologies and coordinating care.  Thank you for allowing me to take part in the care of this patient.  Metta Clines, DO  CC:  Jerene Bears, MD

## 2014-07-12 ENCOUNTER — Encounter: Payer: Self-pay | Admitting: Cardiology

## 2014-07-12 ENCOUNTER — Ambulatory Visit (INDEPENDENT_AMBULATORY_CARE_PROVIDER_SITE_OTHER): Payer: Medicare Other | Admitting: Cardiology

## 2014-07-12 VITALS — BP 157/65 | HR 65 | Ht 61.0 in | Wt 137.0 lb

## 2014-07-12 DIAGNOSIS — Z720 Tobacco use: Secondary | ICD-10-CM

## 2014-07-12 DIAGNOSIS — R0602 Shortness of breath: Secondary | ICD-10-CM

## 2014-07-12 DIAGNOSIS — I5032 Chronic diastolic (congestive) heart failure: Secondary | ICD-10-CM

## 2014-07-12 DIAGNOSIS — I10 Essential (primary) hypertension: Secondary | ICD-10-CM

## 2014-07-12 NOTE — Patient Instructions (Addendum)
Your physician has recommended that you have a pulmonary function test. Pulmonary Function Tests are a group of tests that measure how well air moves in and out of your lungs. Office will contact with results via phone or letter.   Your physician has requested that you regularly monitor and record your blood pressure readings at home. Please take your readings approximately 2 hours after medication.  Check 3-4 x per week till next office visit & bring back for MD review.   Continue all current medications. Follow up in  4 weeks

## 2014-07-12 NOTE — Progress Notes (Signed)
Clinical Summary Tamara Hart is a 80 y.o.female seen today as a new patient for the following medical problems   1. Chronic diastolic heart failure - SOB ongoing for several months. Can be SOB with activity or just talking. DOE at just a few feet. Has some right ankle swelling related to previous fracture, no other LE edema. No orthopnea, no PND - no chest pain, no palpitations.   2. HTN - compliant with meds - checks bp at home at times, but not often.   3. Tobacco abuse - reports she stopped her inhalers several years ago, she is unaware if she was ever diagnosed with COPD. Can have some wheezing at times, seldom cough.  - former tobacco use, second hand smoke exposure with husband Past Medical History  Diagnosis Date  . COPD (chronic obstructive pulmonary disease)   . DJD (degenerative joint disease)   . Mass of breast, right   . Skin neoplasm   . Hypertension   . Vertigo   . Restless legs   . Hypothyroidism   . Shortness of breath   . Esophageal reflux   . Hyperlipidemia   . Hyperhidrosis   . Insomnia   . Depressive disorder   . Pure hypercholesterolemia   . Vitamin B 12 deficiency   . Allergic rhinitis   . Stroke   . Idiopathic thrombocytopenia purpura   . Rosacea   . Broken ankle      Allergies  Allergen Reactions  . Codeine      Current Outpatient Prescriptions  Medication Sig Dispense Refill  . albuterol (VENTOLIN HFA) 108 (90 BASE) MCG/ACT inhaler Inhale 2 puffs into the lungs every 6 (six) hours as needed.      . ALPRAZolam (XANAX) 0.5 MG tablet Take 0.5 mg by mouth 2 (two) times daily as needed.      . Calcium Carb-Cholecalciferol (CALCIUM 500 +D) 500-400 MG-UNIT TABS 1 tablet twice a day     . carbidopa-levodopa (SINEMET) 25-100 MG per tablet Take 1 tablet by mouth 2 (two) times daily.      . citalopram (CELEXA) 20 MG tablet Take 20 mg by mouth daily.      . fluticasone-salmeterol (ADVAIR HFA) 115-21 MCG/ACT inhaler Inhale 2 puffs into the  lungs 2 (two) times daily.      Marland Kitchen glycopyrrolate (ROBINUL) 1 MG tablet Take 1 mg by mouth 3 (three) times daily.      Marland Kitchen HYDROcodone-acetaminophen (LORTAB) 10-500 MG per tablet Take 1 tablet by mouth every 6 (six) hours as needed.     . labetalol (NORMODYNE) 300 MG tablet Take 300 mg by mouth 2 (two) times daily.      Marland Kitchen levothyroxine (SYNTHROID, LEVOTHROID) 100 MCG tablet Take 100 mcg by mouth daily.      . meclizine (ANTIVERT) 25 MG tablet Take 12.5 mg by mouth 2 (two) times daily as needed.      Marland Kitchen omeprazole (PRILOSEC) 20 MG capsule Take 20 mg by mouth daily.      . predniSONE (DELTASONE) 10 MG tablet Take 10 mg by mouth daily with breakfast.    . tiotropium (SPIRIVA HANDIHALER) 18 MCG inhalation capsule One puff every other day 30 capsule 5  . traZODone (DESYREL) 50 MG tablet Take 50 mg by mouth at bedtime.     No current facility-administered medications for this visit.     Past Surgical History  Procedure Laterality Date  . Right shoulder surgery      x3--rotator cuff  .  Total abdominal hysterectomy       Allergies  Allergen Reactions  . Codeine       Family History  Problem Relation Age of Onset  . Heart disease Mother   . Heart disease Father      Social History Tamara Hart reports that she quit smoking about 17 years ago. She does not have any smokeless tobacco history on file. Tamara Hart has no alcohol history on file.   Review of Systems CONSTITUTIONAL: No weight loss, fever, chills, weakness or fatigue.  HEENT: Eyes: No visual loss, blurred vision, double vision or yellow sclerae.No hearing loss, sneezing, congestion, runny nose or sore throat.  SKIN: No rash or itching.  CARDIOVASCULAR: per HPI RESPIRATORY: per HPI GASTROINTESTINAL: No anorexia, nausea, vomiting or diarrhea. No abdominal pain or blood.  GENITOURINARY: No burning on urination, no polyuria NEUROLOGICAL: No headache, dizziness, syncope, paralysis, ataxia, numbness or tingling in the  extremities. No change in bowel or bladder control.  MUSCULOSKELETAL: No muscle, back pain, joint pain or stiffness.  LYMPHATICS: No enlarged nodes. No history of splenectomy.  PSYCHIATRIC: No history of depression or anxiety.  ENDOCRINOLOGIC: No reports of sweating, cold or heat intolerance. No polyuria or polydipsia.  Marland Kitchen   Physical Examination p 65 bp 150/80 Wt 137 lbs BMI 26 Gen: resting comfortably, no acute distress HEENT: no scleral icterus, pupils equal round and reactive, no palptable cervical adenopathy,  CV: RRR, no m/r/g, no JVD, no carotid bruits Resp: Clear to auscultation bilaterally GI: abdomen is soft, non-tender, non-distended, normal bowel sounds, no hepatosplenomegaly MSK: extremities are warm, trace bilateral edema Skin: warm, no rash Neuro:  no focal deficits Psych: appropriate affect   Diagnostic Studies 01/2014 Echo LVEF 60-65%, abnormal diastolic function (no grade reported)  09/2013 Lexiscan MPI LVEF 70%, no ischemia  05/2014 CT PE No PE. + coronary atherosclerosis   Assessment and Plan  1. Chronic diastolic heart failure - appears euvolemic in clinic today - severity of SOB/DOE seems to be out of proportion to her diastolic HF, she was previously on inhalers in the past but stopped several years ago, wonder if element of lung disease is contributing. Will obtain PFTs - continue current diuretic dose  2. HTN - borderline elevated in clinic, based on her age <150/90 however is a reasonable target. She will keep a bp log until next visit, contniue current meds for now  3. Tobacco abuse - former smoker, follow up PFTs.     F/u 4 weeks Arnoldo Lenis, M.D.

## 2014-07-13 ENCOUNTER — Ambulatory Visit: Payer: Medicare Other | Admitting: Cardiology

## 2014-07-24 LAB — PULMONARY FUNCTION TEST

## 2014-07-31 ENCOUNTER — Other Ambulatory Visit: Payer: Self-pay | Admitting: *Deleted

## 2014-07-31 DIAGNOSIS — R0602 Shortness of breath: Secondary | ICD-10-CM

## 2014-08-10 ENCOUNTER — Encounter: Payer: Self-pay | Admitting: Cardiology

## 2014-08-10 ENCOUNTER — Ambulatory Visit (INDEPENDENT_AMBULATORY_CARE_PROVIDER_SITE_OTHER): Payer: Medicare Other | Admitting: Cardiology

## 2014-08-10 VITALS — BP 173/83 | HR 59 | Ht 61.0 in | Wt 138.0 lb

## 2014-08-10 DIAGNOSIS — I5032 Chronic diastolic (congestive) heart failure: Secondary | ICD-10-CM

## 2014-08-10 DIAGNOSIS — R0602 Shortness of breath: Secondary | ICD-10-CM

## 2014-08-10 DIAGNOSIS — I10 Essential (primary) hypertension: Secondary | ICD-10-CM

## 2014-08-10 MED ORDER — AMLODIPINE BESYLATE 5 MG PO TABS: 5.0000 mg | ORAL_TABLET | Freq: Every day | ORAL | Status: DC

## 2014-08-10 NOTE — Patient Instructions (Signed)
   Begin Norvasc 5mg  daily - new sent to  Pharm Continue all other medications.   Your physician has requested that you regularly monitor and record your blood pressure readings at home. Please take your readings approximately 2 hours after medication.  Take 3-4 x per week and take readings to your next family doctor visit.   Your physician wants you to follow up in: 6 months.  You will receive a reminder letter in the mail one-two months in advance.  If you don't receive a letter, please call our office to schedule the follow up appointment

## 2014-08-10 NOTE — Progress Notes (Signed)
Clinical Summary Ms. Nazareno is a 80 y.o.female seen today as a new patient for the following medical problems.   1. Chronic diastolic heart failure - SOB ongoing for several months. Can be SOB with activity or just talking. DOE at just a few feet. Has some right ankle swelling related to previous fracture, no other LE edema. No orthopnea, no PND - no chest pain, no palpitations.  -   2. HTN - compliant with meds 150s/60-70s - checks bp at home at times, but not often.   3. Tobacco abuse - reports she stopped her inhalers several years ago, she is unaware if she was ever diagnosed with COPD. Can have some wheezing at times, seldom cough.  - former tobacco use, second hand smoke exposure with husband - CXR with chronic interstitial coarsening   Past Medical History  Diagnosis Date  . COPD (chronic obstructive pulmonary disease)   . DJD (degenerative joint disease)   . Mass of breast, right   . Skin neoplasm   . Hypertension   . Vertigo   . Restless legs   . Hypothyroidism   . Shortness of breath   . Esophageal reflux   . Hyperlipidemia   . Hyperhidrosis   . Insomnia   . Depressive disorder   . Pure hypercholesterolemia   . Vitamin B 12 deficiency   . Allergic rhinitis   . Stroke   . Idiopathic thrombocytopenia purpura   . Rosacea   . Broken ankle      Allergies  Allergen Reactions  . Codeine      Current Outpatient Prescriptions  Medication Sig Dispense Refill  . albuterol (VENTOLIN HFA) 108 (90 BASE) MCG/ACT inhaler Inhale 2 puffs into the lungs every 6 (six) hours as needed.      . ALPRAZolam (XANAX) 0.5 MG tablet Take 0.5 mg by mouth 2 (two) times daily as needed.      . Calcium Carb-Cholecalciferol (CALCIUM 500 +D) 500-400 MG-UNIT TABS 1 tablet twice a day     . carbidopa-levodopa (SINEMET) 25-100 MG per tablet Take 1 tablet by mouth 2 (two) times daily.      . citalopram (CELEXA) 20 MG tablet Take 20 mg by mouth daily.      . furosemide (LASIX)  20 MG tablet Take 20 mg by mouth daily.    Marland Kitchen glycopyrrolate (ROBINUL) 1 MG tablet Take 1 mg by mouth 3 (three) times daily.      Marland Kitchen HYDROcodone-acetaminophen (NORCO) 7.5-325 MG per tablet Take 1 tablet by mouth 2 (two) times daily as needed for moderate pain.    Marland Kitchen labetalol (NORMODYNE) 200 MG tablet Take 200 mg by mouth 2 (two) times daily.    Marland Kitchen levothyroxine (SYNTHROID, LEVOTHROID) 88 MCG tablet Take 88 mcg by mouth daily before breakfast.    . meclizine (ANTIVERT) 25 MG tablet Take 12.5 mg by mouth 2 (two) times daily as needed.      Marland Kitchen omeprazole (PRILOSEC) 20 MG capsule Take 20 mg by mouth daily.      . predniSONE (DELTASONE) 10 MG tablet Take 10 mg by mouth daily with breakfast.    . tiotropium (SPIRIVA HANDIHALER) 18 MCG inhalation capsule One puff every other day 30 capsule 5  . traZODone (DESYREL) 50 MG tablet Take 100 mg by mouth at bedtime.     No current facility-administered medications for this visit.     Past Surgical History  Procedure Laterality Date  . Right shoulder surgery  x3--rotator cuff  . Total abdominal hysterectomy       Allergies  Allergen Reactions  . Codeine       Family History  Problem Relation Age of Onset  . Heart disease Mother   . Heart disease Father      Social History Ms. Timmons reports that she quit smoking about 17 years ago. She started smoking about 70 years ago. She has never used smokeless tobacco. Ms. Curto reports that she does not drink alcohol.   Review of Systems CONSTITUTIONAL: No weight loss, fever, chills, weakness or fatigue.  HEENT: Eyes: No visual loss, blurred vision, double vision or yellow sclerae.No hearing loss, sneezing, congestion, runny nose or sore throat.  SKIN: No rash or itching.  CARDIOVASCULAR: per HPI RESPIRATORY: per HPI.  GASTROINTESTINAL: No anorexia, nausea, vomiting or diarrhea. No abdominal pain or blood.  GENITOURINARY: No burning on urination, no polyuria NEUROLOGICAL: No headache,  dizziness, syncope, paralysis, ataxia, numbness or tingling in the extremities. No change in bowel or bladder control.  MUSCULOSKELETAL: No muscle, back pain, joint pain or stiffness.  LYMPHATICS: No enlarged nodes. No history of splenectomy.  PSYCHIATRIC: No history of depression or anxiety.  ENDOCRINOLOGIC: No reports of sweating, cold or heat intolerance. No polyuria or polydipsia.  Marland Kitchen   Physical Examination p 59 bp 150/70 Wt 138 lbs BMI 26 Gen: resting comfortably, no acute distress HEENT: no scleral icterus, pupils equal round and reactive, no palptable cervical adenopathy,  CV: RRR, no m/r/g, no JVD, no carotid bruits Resp: Clear to auscultation bilaterally GI: abdomen is soft, non-tender, non-distended, normal bowel sounds, no hepatosplenomegaly MSK: extremities are warm, no edema.  Skin: warm, no rash Neuro:  no focal deficits Psych: appropriate affect   Diagnostic Studies 01/2014 Echo LVEF 60-65%, abnormal diastolic function (no grade reported)  09/2013 Lexiscan MPI LVEF 70%, no ischemia  05/2014 CT PE No PE. + coronary atherosclerosis  07/2014 PFTs Technically difficult. Combined obstructive and restrictive disease, obstruction reported as mild.   Assessment and Plan   1. Chronic diastolic heart failure - appears euvolemic in clinic today - severity of SOB/DOE seems out of proportion to her diastolic HF. Stress test also negative 09/2013. Recent PFTs with some evidence of restrictive and obstructive disease, CXR with chronic interstitial changes. Likely pulmonary component of her symptoms, will forward PFT results to pcp - continue current meds    2. HTN - above goal, will start norvasc 5mg  daily. She will keep bp log and submis   F/u 6 months    Arnoldo Lenis, M.D.

## 2014-12-12 ENCOUNTER — Encounter: Payer: Self-pay | Admitting: Neurology

## 2014-12-12 ENCOUNTER — Ambulatory Visit (INDEPENDENT_AMBULATORY_CARE_PROVIDER_SITE_OTHER): Payer: Medicare Other | Admitting: Neurology

## 2014-12-12 VITALS — BP 156/73 | HR 64 | Ht 61.0 in | Wt 137.0 lb

## 2014-12-12 DIAGNOSIS — R51 Headache: Secondary | ICD-10-CM

## 2014-12-12 DIAGNOSIS — G4486 Cervicogenic headache: Secondary | ICD-10-CM

## 2014-12-12 DIAGNOSIS — M545 Low back pain, unspecified: Secondary | ICD-10-CM

## 2014-12-12 DIAGNOSIS — R519 Headache, unspecified: Secondary | ICD-10-CM

## 2014-12-12 DIAGNOSIS — R269 Unspecified abnormalities of gait and mobility: Secondary | ICD-10-CM | POA: Diagnosis not present

## 2014-12-12 NOTE — Progress Notes (Signed)
PATIENT: Tamara Hart DOB: [birth year]  HISTORICAL  Tamara Hart is 81 years old right-handed female, accompanied by her daughter [name], referred by her primary care PA Tawni Carnes for evaluation of increased tremor, gait difficulty  She had a history of COPD, depression, was noted to have gradual onset bilateral hands tremor since 2014, gradually getting worse, most noticeable when she writing, holding object with her right hand, she also complains of chronic low back pain, right shoulder pain, has been taking prednisone 10 mg every day over the past 3 months, which does help her pain,  Her brother has significant bilateral hands tremor, started in his early seventies,  Patient also developed gradual onset gait difficulty, midline low back pain, She denied loss sense of smell, no REM sleep disorder, she is sedentary, sitting down most of the time, using cane, wheelchair for longer distance, also has mild memory trouble  She lives with her daughter's family, no longer driving short-term memory trouble   REVIEW OF SYSTEMS: Full 14 system review of systems performed and notable only for as above   ALLERGIES: Allergies  Allergen Reactions  . Codeine     HOME MEDICATIONS: Current Outpatient Prescriptions  Medication Sig Dispense Refill  . ALPRAZolam (XANAX) 0.5 MG tablet Take 0.5 mg by mouth 2 (two) times daily as needed.      Marland Kitchen amLODipine (NORVASC) 5 MG tablet Take 1 tablet (5 mg total) by mouth daily. 30 tablet 6  . buPROPion (WELLBUTRIN XL) 150 MG 24 hr tablet Take 150 mg by mouth daily.    . Calcium Carb-Cholecalciferol (CALCIUM 500 +D) 500-400 MG-UNIT TABS 1 tablet twice a day     . carbidopa-levodopa (SINEMET) 25-100 MG per tablet Take 1 tablet by mouth 2 (two) times daily.      Marland Kitchen FLUoxetine (PROZAC) 20 MG capsule Take 20 mg by mouth daily.    . furosemide (LASIX) 20 MG tablet Take 20 mg by mouth daily.    Marland Kitchen HYDROcodone-acetaminophen (NORCO) 7.5-325 MG per tablet  Take 1 tablet by mouth 2 (two) times daily as needed for moderate pain.    Marland Kitchen labetalol (NORMODYNE) 200 MG tablet Take 200 mg by mouth 2 (two) times daily.    Marland Kitchen levothyroxine (SYNTHROID, LEVOTHROID) 88 MCG tablet Take 88 mcg by mouth daily before breakfast.    . omeprazole (PRILOSEC) 20 MG capsule Take 20 mg by mouth daily.      . predniSONE (DELTASONE) 10 MG tablet Take 10 mg by mouth daily with breakfast.    . traZODone (DESYREL) 100 MG tablet Take 100 mg by mouth at bedtime.     No current facility-administered medications for this visit.    PAST MEDICAL HISTORY: Past Medical History  Diagnosis Date  . COPD (chronic obstructive pulmonary disease)   . DJD (degenerative joint disease)   . Mass of breast, right   . Skin neoplasm   . Hypertension   . Vertigo   . Restless legs   . Hypothyroidism   . Shortness of breath   . Esophageal reflux   . Hyperlipidemia   . Hyperhidrosis   . Insomnia   . Depressive disorder   . Pure hypercholesterolemia   . Vitamin B 12 deficiency   . Allergic rhinitis   . Stroke   . Idiopathic thrombocytopenia purpura   . Rosacea   . Broken ankle   . Tremors of nervous system   . COPD (chronic obstructive pulmonary disease)     PAST SURGICAL  HISTORY: Past Surgical History  Procedure Laterality Date  . Right shoulder surgery      x3--rotator cuff  . Total abdominal hysterectomy      FAMILY HISTORY: Family History  Problem Relation Age of Onset  . Heart disease Mother   . Heart disease Father   . Dementia Mother   . Stroke Father     SOCIAL HISTORY:  History   Social History  . Marital Status: Married    Spouse Name: N/A  . Number of Children: 1  . Years of Education: 7th grade   Occupational History  . retired    Social History Main Topics  . Smoking status: Former Smoker -- 1.00 packs/day for 3 years    Types: Cigarettes    Start date: 06/14/1944    Quit date: 08/24/1996  . Smokeless tobacco: Never Used  . Alcohol Use: No    . Drug Use: No  . Sexual Activity: Not on file   Other Topics Concern  . Not on file   Social History Narrative   Lives at home with daughter.   Right-handed.   Occasional caffeine use.    PHYSICAL EXAM   Filed Vitals:   12/12/14 1354  BP: 156/73  Pulse: 64  Height: 5\' 1"  (1.549 m)  Weight: 137 lb (62.143 kg)    Not recorded      Body mass index is 25.9 kg/(m^2).  PHYSICAL EXAMNIATION:  Gen: NAD, conversant, well nourised, obese, well groomed                     Cardiovascular: Regular rate rhythm, no peripheral edema, warm, nontender. Eyes: Conjunctivae clear without exudates or hemorrhage Neck: Supple, no carotid bruise. Pulmonary: Clear to auscultation bilaterally   NEUROLOGICAL EXAM:  MENTAL STATUS: Speech:    Speech is normal; fluent and spontaneous with normal comprehension.  Cognition:  Mini-Mental Status Examination is 21 out of 30, she is not oriented to time place, missed 3 out of 3 recalls, has difficulty copy design  CRANIAL NERVES: CN II: Visual fields are full to confrontation. Fundoscopic exam is normal with sharp discs and no vascular changes. Venous pulsations are present bilaterally. Pupils are 4 mm and briskly reactive to light. Visual acuity is 20/20 bilaterally. CN III, IV, VI: extraocular movement are normal. No ptosis. CN V: Facial sensation is intact to pinprick in all 3 divisions bilaterally. Corneal responses are intact.  CN VII: Face is symmetric with normal eye closure and smile. CN VIII: Hearing is normal to rubbing fingers CN IX, X: Palate elevates symmetrically. Phonation is normal. CN XI: Head turning and shoulder shrug are intact CN XII: Tongue is midline with normal movements and no atrophy.  MOTOR: There is no pronator drift of out-stretched arms. Muscle bulk and tone are normal. Muscle strength is normal.   Shoulder abduction Shoulder external rotation Elbow flexion Elbow extension Wrist flexion Wrist extension Finger  abduction Hip flexion Knee flexion Knee extension Ankle dorsi flexion Ankle plantar flexion  R 5 5 5 5 5 5 5 5 5 5 5 5   L 5 5 5 5 5 5 5 5 5 5 5 5     REFLEXES: Reflexes are 2+ and symmetric at the biceps, triceps, knees, and ankles. Plantar responses are flexor.  SENSORY: Length dependent decreased Light touch, pinprick to ankle level, position sense, and vibration sense are intact in fingers and toes.  COORDINATION: Rapid alternating movements and fine finger movements are intact. There is no dysmetria  on finger-to-nose and heel-knee-shin. There are no abnormal or extraneous movements.   GAIT/STANCE:  need to push up to get up from seated position, cautious, wide-based, mildly unsteady   DIAGNOSTIC DATA (LABS, IMAGING, TESTING) - I reviewed patient records, labs, notes, testing and imaging myself where available.  Lab Results  Component Value Date   HGB 12.6 09/20/2008   HCT 37.0 09/20/2008      Component Value Date/Time   NA 139 09/20/2008 0910   K 4.2 09/20/2008 0910   CL 105 09/20/2008 0910   GLUCOSE 95 09/20/2008 0910   BUN 29* 09/20/2008 0910   CREATININE 1.0 09/20/2008 0910   ASSESSMENT AND PLAN  TEMPESTT SILBA is a 81 y.o. female    1, mild bilateral hands postural tremor, consistent with essential tremor, check TSH at her next yearly follow-up  2, gradual onset gait difficulty, chronic low back pain, mild distal weakness, sensory loss, suggestive of lumbar radiculopathy, peripheral neuropathy, MRI of lumbar, EMG nerve conduction study 3. Physical therapy 4. Cognitive impairment, Mini-Mental Status Examination is 21 out of 30 today, if she continue to progressive memory loss, consider further evaluation with MRI of brain   Marcial Pacas, M.D. Ph.D.  [facility] [address] [place], [place] [address] Ph: [phone] Fax: [phone]

## 2014-12-27 ENCOUNTER — Encounter: Payer: Medicare Other | Admitting: Radiology

## 2014-12-27 ENCOUNTER — Inpatient Hospital Stay: Admission: RE | Admit: 2014-12-27 | Payer: Medicare Other | Source: Ambulatory Visit

## 2015-01-01 ENCOUNTER — Ambulatory Visit (INDEPENDENT_AMBULATORY_CARE_PROVIDER_SITE_OTHER): Payer: Medicare Other | Admitting: Neurology

## 2015-01-01 ENCOUNTER — Ambulatory Visit (INDEPENDENT_AMBULATORY_CARE_PROVIDER_SITE_OTHER): Payer: Self-pay | Admitting: Neurology

## 2015-01-01 ENCOUNTER — Encounter (INDEPENDENT_AMBULATORY_CARE_PROVIDER_SITE_OTHER): Payer: Self-pay

## 2015-01-01 DIAGNOSIS — R2689 Other abnormalities of gait and mobility: Secondary | ICD-10-CM

## 2015-01-01 DIAGNOSIS — G4486 Cervicogenic headache: Secondary | ICD-10-CM

## 2015-01-01 DIAGNOSIS — R51 Headache: Principal | ICD-10-CM

## 2015-01-01 DIAGNOSIS — M545 Low back pain, unspecified: Secondary | ICD-10-CM

## 2015-01-01 DIAGNOSIS — R269 Unspecified abnormalities of gait and mobility: Secondary | ICD-10-CM

## 2015-01-01 DIAGNOSIS — R519 Headache, unspecified: Secondary | ICD-10-CM

## 2015-01-01 DIAGNOSIS — Z0289 Encounter for other administrative examinations: Secondary | ICD-10-CM

## 2015-01-01 MED ORDER — CLONAZEPAM 0.5 MG PO TABS: ORAL_TABLET | ORAL | Status: DC

## 2015-01-01 NOTE — Progress Notes (Signed)
Electrodiagnostic study today is normal,  She has fell, presented to local emergency room a few days ago, continue have worsening memory trouble, gait difficulty, worsening bilateral hands tremor,  Her trazodone, and Xanax has stopped since her ER visit, complaining worsening anxiety, bilateral hands tremor,  I started clonazepam 0.5 milligrams half to one tablets twice a day,  MRI of the brain arranged by primary care, and emergency room,  Return to clinic in 2 weeks, bring MRI report and CD to review, continue PT

## 2015-01-01 NOTE — Procedures (Signed)
   NCS (NERVE CONDUCTION STUDY) WITH EMG (ELECTROMYOGRAPHY) REPORT   STUDY DATE: Jan 01 2015 PATIENT NAME: Tamara Hart DOB: [birth year] MRN: [id number]    TECHNOLOGIST: Towana Badger ELECTROMYOGRAPHER: Marcial Pacas M.D.  CLINICAL INFORMATION: 81 year old female, with chronic low back pain, gradual onset gait difficulty, bilateral hands tremor  FINDINGS: NERVE CONDUCTION STUDY: Bilateral sural sensory responses were normal. Bilateral peroneal to EDB, tibial motor responses were normal.  NEEDLE ELECTROMYOGRAPHY: Selected needle examination was performed at right lower extremity muscles, right lumbosacral paraspinal muscles.  Needle examination of right tibialis anterior, tibialis posterior,  medial gastrocnemius, biceps femoris long head was normal  There was no spontaneous activity at right lumbar sacral paraspinal muscles, right L4-5 S1.  IMPRESSION: This is a normal study. There was no electrodiagnostic evidence of large fiber peripheral neuropathy, or right lumbosacral radiculopathy.   INTERPRETING PHYSICIAN:   Marcial Pacas M.D. Ph.D. [facility] [address], [place], [place] [address] [phone]

## 2015-01-15 ENCOUNTER — Ambulatory Visit (INDEPENDENT_AMBULATORY_CARE_PROVIDER_SITE_OTHER): Payer: Medicare Other | Admitting: Neurology

## 2015-01-15 ENCOUNTER — Encounter: Payer: Self-pay | Admitting: Neurology

## 2015-01-15 ENCOUNTER — Encounter (INDEPENDENT_AMBULATORY_CARE_PROVIDER_SITE_OTHER): Payer: Self-pay

## 2015-01-15 VITALS — BP 136/73 | HR 62 | Ht 61.0 in | Wt 132.0 lb

## 2015-01-15 DIAGNOSIS — R269 Unspecified abnormalities of gait and mobility: Secondary | ICD-10-CM

## 2015-01-15 DIAGNOSIS — R413 Other amnesia: Secondary | ICD-10-CM | POA: Diagnosis not present

## 2015-01-15 MED ORDER — MEMANTINE HCL 10 MG PO TABS: 10.0000 mg | ORAL_TABLET | Freq: Two times a day (BID) | ORAL | Status: DC

## 2015-01-15 NOTE — Progress Notes (Signed)
Chief Complaint  Patient presents with  . Unilateral headache    She is here with her daughter, [name].  They would like to discuss her MRI brain and lab results.  . Back Pain    They would like to discuss MRI lumbar and EMG/NCV results.      PATIENT: Tamara Hart DOB: 1925-04-07  HISTORICAL  Tamara Hart is 81 years old right-handed female, accompanied by her daughter [name], referred by her primary care PA Tawni Carnes for evaluation of increased tremor, gait difficulty  She had a history of COPD, depression, was noted to have gradual onset bilateral hands tremor since 2014, gradually getting worse, most noticeable when she writing, holding object with her right hand, she also complains of chronic low back pain, right shoulder pain, has been taking prednisone 10 mg every day over the past 3 months, which does help her pain,  Her brother has significant bilateral hands tremor, started in his early seventies,  Patient also developed gradual onset gait difficulty, midline low back pain, She denied loss sense of smell, no REM sleep disorder, she is sedentary, sitting down most of the time, using cane, wheelchair for longer distance, also has mild memory trouble  She lives with her daughter's family, no longer driving  UPDATE May 95GL 2016: She is now taking clonazepam 0.5mg  qhs, which has helped her sleep, she also takes over-the-counter sleeping aid. She is taking sentiment 25/100 mg twice a day for her restless leg symptoms, she continue complains of chronic low back pain, mild gait difficulty,  I have reviewed MRI lumbar with patient and her family, this was done at [facility] in August 01 2014, multilevel degenerative disc disease, most severe at L3-4, L4-5, with foraminal stenosis, mild to moderate, but there was no significant canal stenosis,  MRI of brain in Jan 11 2015, diffuse generalized moderate atrophy, moderate periventricular small vessel disease.  REVIEW OF  SYSTEMS: Full 14 system review of systems performed and notable only for as above   ALLERGIES: Allergies  Allergen Reactions  . Codeine     HOME MEDICATIONS: Current Outpatient Prescriptions  Medication Sig Dispense Refill  . ALPRAZolam (XANAX) 0.5 MG tablet Take 0.5 mg by mouth 2 (two) times daily as needed.      Marland Kitchen amLODipine (NORVASC) 5 MG tablet Take 1 tablet (5 mg total) by mouth daily. 30 tablet 6  . buPROPion (WELLBUTRIN XL) 150 MG 24 hr tablet Take 150 mg by mouth daily.    . Calcium Carb-Cholecalciferol (CALCIUM 500 +D) 500-400 MG-UNIT TABS 1 tablet twice a day     . carbidopa-levodopa (SINEMET) 25-100 MG per tablet Take 1 tablet by mouth 2 (two) times daily.      Marland Kitchen FLUoxetine (PROZAC) 20 MG capsule Take 20 mg by mouth daily.    . furosemide (LASIX) 20 MG tablet Take 20 mg by mouth daily.    Marland Kitchen HYDROcodone-acetaminophen (NORCO) 7.5-325 MG per tablet Take 1 tablet by mouth 2 (two) times daily as needed for moderate pain.    Marland Kitchen labetalol (NORMODYNE) 200 MG tablet Take 200 mg by mouth 2 (two) times daily.    Marland Kitchen levothyroxine (SYNTHROID, LEVOTHROID) 88 MCG tablet Take 88 mcg by mouth daily before breakfast.    . omeprazole (PRILOSEC) 20 MG capsule Take 20 mg by mouth daily.      . predniSONE (DELTASONE) 10 MG tablet Take 10 mg by mouth daily with breakfast.    . traZODone (DESYREL) 100 MG tablet Take 100  mg by mouth at bedtime.     No current facility-administered medications for this visit.    PAST MEDICAL HISTORY: Past Medical History  Diagnosis Date  . COPD (chronic obstructive pulmonary disease)   . DJD (degenerative joint disease)   . Mass of breast, right   . Skin neoplasm   . Hypertension   . Vertigo   . Restless legs   . Hypothyroidism   . Shortness of breath   . Esophageal reflux   . Hyperlipidemia   . Hyperhidrosis   . Insomnia   . Depressive disorder   . Pure hypercholesterolemia   . Vitamin B 12 deficiency   . Allergic rhinitis   . Stroke   . Idiopathic  thrombocytopenia purpura   . Rosacea   . Broken ankle   . Tremors of nervous system   . COPD (chronic obstructive pulmonary disease)     PAST SURGICAL HISTORY: Past Surgical History  Procedure Laterality Date  . Right shoulder surgery      x3--rotator cuff  . Total abdominal hysterectomy      FAMILY HISTORY: Family History  Problem Relation Age of Onset  . Heart disease Mother   . Heart disease Father   . Dementia Mother   . Stroke Father     SOCIAL HISTORY:  History   Social History  . Marital Status: Married    Spouse Name: N/A  . Number of Children: 1  . Years of Education: 7th grade   Occupational History  . retired    Social History Main Topics  . Smoking status: Former Smoker -- 1.00 packs/day for 3 years    Types: Cigarettes    Start date: 06/14/1944    Quit date: 08/24/1996  . Smokeless tobacco: Never Used  . Alcohol Use: No  . Drug Use: No  . Sexual Activity: Not on file   Other Topics Concern  . Not on file   Social History Narrative   Lives at home with daughter.   Right-handed.   Occasional caffeine use.    PHYSICAL EXAM   Filed Vitals:   01/15/15 1333  BP: 136/73  Pulse: 62  Height: 5\' 1"  (1.549 m)  Weight: 132 lb (59.875 kg)    Not recorded      Body mass index is 24.95 kg/(m^2).  PHYSICAL EXAMNIATION:  Gen: NAD, conversant, well nourised, obese, well groomed                     Cardiovascular: Regular rate rhythm, no peripheral edema, warm, nontender. Eyes: Conjunctivae clear without exudates or hemorrhage Neck: Supple, no carotid bruise. Pulmonary: Clear to auscultation bilaterally   NEUROLOGICAL EXAM:  MENTAL STATUS: Speech:    Speech is normal; fluent and spontaneous with normal comprehension.  Cognition:  Mini-Mental Status Examination was 21 out of 30 in April 2016, today, she is not oriented to date, place  CRANIAL NERVES: CN II: Visual fields are full to confrontation. Fundoscopic exam is normal with sharp  discs and no vascular changes. Pupils are 4 mm and briskly reactive to light. CN III, IV, VI: extraocular movement are normal. No ptosis. CN V: Facial sensation is intact to pinprick in all 3 divisions bilaterally. Corneal responses are intact.  CN VII: Face is symmetric with normal eye closure and smile. CN VIII: Hearing is normal to rubbing fingers CN IX, X: Palate elevates symmetrically. Phonation is normal. CN XI: Head turning and shoulder shrug are intact CN XII: Tongue is midline with  normal movements and no atrophy.  MOTOR: There is no pronator drift of out-stretched arms. Muscle bulk and tone are normal. Muscle strength is normal.   REFLEXES: Reflexes are 2+ and symmetric at the biceps, triceps, knees, and ankles. Plantar responses are flexor.  SENSORY: Length dependent decreased Light touch, pinprick to ankle level, position sense, and vibration sense are intact in fingers and toes.  COORDINATION: Rapid alternating movements and fine finger movements are intact. There is no dysmetria on finger-to-nose and heel-knee-shin. There are no abnormal or extraneous movements.   GAIT/STANCE:  need to push up to get up from seated position, cautious, wide-based, mildly unsteady   DIAGNOSTIC DATA (LABS, IMAGING, TESTING) - I reviewed patient records, labs, notes, testing and imaging myself where available.  Lab Results  Component Value Date   HGB 12.6 09/20/2008   HCT 37.0 09/20/2008      Component Value Date/Time   NA 139 09/20/2008 0910   K 4.2 09/20/2008 0910   CL 105 09/20/2008 0910   GLUCOSE 95 09/20/2008 0910   BUN 29* 09/20/2008 0910   CREATININE 1.0 09/20/2008 0910   ASSESSMENT AND PLAN  Tamara Hart is a 81 y.o. female     1, mild bilateral hands postural tremor, consistent with essential tremor, check TSH at her next yearly follow-up  2, gradual onset gait difficulty: Combination of periventricular white matter disease, deconditioning, chronic low back  pain, 3.Cognitive impairment, Mini-Mental Status Examination was 21 out of 30 today, add on Namenda 10 mg twice a day 4, return to clinic in 3 months   Marcial Pacas, M.D. Ph.D.  [facility] [address], [place], [place] [address] Ph: [phone] Fax: [phone]

## 2015-01-16 ENCOUNTER — Telehealth: Payer: Self-pay | Admitting: Neurology

## 2015-01-16 DIAGNOSIS — E538 Deficiency of other specified B group vitamins: Secondary | ICD-10-CM

## 2015-01-16 LAB — ANA W/REFLEX IF POSITIVE: Anti Nuclear Antibody(ANA): NEGATIVE

## 2015-01-16 LAB — SEDIMENTATION RATE: Sed Rate: 26 mm/hr (ref 0–40)

## 2015-01-16 LAB — C-REACTIVE PROTEIN: CRP: 6.7 mg/L — AB (ref 0.0–4.9)

## 2015-01-16 LAB — VITAMIN B12: Vitamin B-12: 145 pg/mL — ABNORMAL LOW (ref 211–946)

## 2015-01-16 NOTE — Telephone Encounter (Signed)
Please call patient, her vitamin B level was low 145,  She needs to come back or to close by hospital/clinic  for repeat laboratory labs, order placed. Start B12 supplement after repeat lab.  B12 1085mcg im qday xone week, then q week xone month, then every month

## 2015-01-16 NOTE — Telephone Encounter (Signed)
Spoke to [employer] (pt's dgt on [employer]) - she is aware of results and would like to take her mother to her PCP for labs.  She is treated by Tawni Carnes, PA at [address] in [place].  I called her office [phone]) and spoke to War, who informed me they could accommodate the orders of repeat labs and B12 injections, if needed.  I will fax over Dr. Rhea Belton orders to [facility] attention at [phone].

## 2015-02-18 ENCOUNTER — Other Ambulatory Visit: Payer: Self-pay

## 2015-03-25 ENCOUNTER — Encounter: Payer: Self-pay | Admitting: Physician Assistant

## 2015-03-25 ENCOUNTER — Emergency Department (HOSPITAL_COMMUNITY): Payer: Medicare Other

## 2015-03-25 ENCOUNTER — Encounter (HOSPITAL_COMMUNITY): Admission: AD | Payer: Self-pay | Source: Ambulatory Visit

## 2015-03-25 ENCOUNTER — Inpatient Hospital Stay (HOSPITAL_COMMUNITY)
Admission: EM | Admit: 2015-03-25 | Discharge: 2015-03-27 | DRG: 247 | Disposition: A | Discharge status: Home / Self Care | Payer: Medicare Other | Attending: Cardiology | Admitting: Cardiology

## 2015-03-25 DIAGNOSIS — Z8249 Family history of ischemic heart disease and other diseases of the circulatory system: Secondary | ICD-10-CM | POA: Diagnosis not present

## 2015-03-25 DIAGNOSIS — E785 Hyperlipidemia, unspecified: Secondary | ICD-10-CM

## 2015-03-25 DIAGNOSIS — Z955 Presence of coronary angioplasty implant and graft: Secondary | ICD-10-CM

## 2015-03-25 DIAGNOSIS — F329 Major depressive disorder, single episode, unspecified: Secondary | ICD-10-CM | POA: Diagnosis present

## 2015-03-25 DIAGNOSIS — Z79891 Long term (current) use of opiate analgesic: Secondary | ICD-10-CM | POA: Diagnosis not present

## 2015-03-25 DIAGNOSIS — I10 Essential (primary) hypertension: Secondary | ICD-10-CM

## 2015-03-25 DIAGNOSIS — G3184 Mild cognitive impairment, so stated: Secondary | ICD-10-CM | POA: Diagnosis present

## 2015-03-25 DIAGNOSIS — R61 Generalized hyperhidrosis: Secondary | ICD-10-CM | POA: Diagnosis present

## 2015-03-25 DIAGNOSIS — G2581 Restless legs syndrome: Secondary | ICD-10-CM | POA: Diagnosis present

## 2015-03-25 DIAGNOSIS — R9431 Abnormal electrocardiogram [ECG] [EKG]: Secondary | ICD-10-CM

## 2015-03-25 DIAGNOSIS — I5032 Chronic diastolic (congestive) heart failure: Secondary | ICD-10-CM | POA: Diagnosis not present

## 2015-03-25 DIAGNOSIS — N183 Chronic kidney disease, stage 3 unspecified: Secondary | ICD-10-CM

## 2015-03-25 DIAGNOSIS — Z8673 Personal history of transient ischemic attack (TIA), and cerebral infarction without residual deficits: Secondary | ICD-10-CM | POA: Diagnosis not present

## 2015-03-25 DIAGNOSIS — K219 Gastro-esophageal reflux disease without esophagitis: Secondary | ICD-10-CM

## 2015-03-25 DIAGNOSIS — E039 Hypothyroidism, unspecified: Secondary | ICD-10-CM | POA: Diagnosis present

## 2015-03-25 DIAGNOSIS — G4700 Insomnia, unspecified: Secondary | ICD-10-CM | POA: Diagnosis present

## 2015-03-25 DIAGNOSIS — I491 Atrial premature depolarization: Secondary | ICD-10-CM

## 2015-03-25 DIAGNOSIS — Z885 Allergy status to narcotic agent status: Secondary | ICD-10-CM

## 2015-03-25 DIAGNOSIS — I2510 Atherosclerotic heart disease of native coronary artery without angina pectoris: Secondary | ICD-10-CM | POA: Diagnosis not present

## 2015-03-25 DIAGNOSIS — Z79899 Other long term (current) drug therapy: Secondary | ICD-10-CM | POA: Diagnosis not present

## 2015-03-25 DIAGNOSIS — I498 Other specified cardiac arrhythmias: Secondary | ICD-10-CM

## 2015-03-25 DIAGNOSIS — I214 Non-ST elevation (NSTEMI) myocardial infarction: Principal | ICD-10-CM

## 2015-03-25 DIAGNOSIS — I129 Hypertensive chronic kidney disease with stage 1 through stage 4 chronic kidney disease, or unspecified chronic kidney disease: Secondary | ICD-10-CM | POA: Diagnosis present

## 2015-03-25 DIAGNOSIS — Z87891 Personal history of nicotine dependence: Secondary | ICD-10-CM | POA: Diagnosis not present

## 2015-03-25 DIAGNOSIS — R079 Chest pain, unspecified: Secondary | ICD-10-CM | POA: Diagnosis not present

## 2015-03-25 DIAGNOSIS — J449 Chronic obstructive pulmonary disease, unspecified: Secondary | ICD-10-CM | POA: Diagnosis present

## 2015-03-25 DIAGNOSIS — R42 Dizziness and giddiness: Secondary | ICD-10-CM

## 2015-03-25 DIAGNOSIS — R001 Bradycardia, unspecified: Secondary | ICD-10-CM | POA: Diagnosis present

## 2015-03-25 DIAGNOSIS — R7989 Other specified abnormal findings of blood chemistry: Secondary | ICD-10-CM

## 2015-03-25 HISTORY — DX: Pneumonia, unspecified organism: J18.9

## 2015-03-25 HISTORY — DX: Unspecified abnormalities of gait and mobility: R26.9

## 2015-03-25 HISTORY — DX: Mild cognitive impairment of uncertain or unknown etiology: G31.84

## 2015-03-25 HISTORY — DX: Low back pain, unspecified: M54.50

## 2015-03-25 HISTORY — DX: Dependence on supplemental oxygen: Z99.81

## 2015-03-25 HISTORY — DX: Other chronic pain: G89.29

## 2015-03-25 HISTORY — DX: Low back pain: M54.5

## 2015-03-25 HISTORY — DX: Atherosclerotic heart disease of native coronary artery without angina pectoris: I25.10

## 2015-03-25 HISTORY — DX: Anxiety disorder, unspecified: F41.9

## 2015-03-25 HISTORY — DX: Unspecified osteoarthritis, unspecified site: M19.90

## 2015-03-25 LAB — CBC
HCT: 35.4 % — ABNORMAL LOW (ref 36.0–46.0)
Hemoglobin: 11.7 g/dL — ABNORMAL LOW (ref 12.0–15.0)
MCH: 30.5 pg (ref 26.0–34.0)
MCHC: 33.1 g/dL (ref 30.0–36.0)
MCV: 92.2 fL (ref 78.0–100.0)
PLATELETS: 247 10*3/uL (ref 150–400)
RBC: 3.84 MIL/uL — ABNORMAL LOW (ref 3.87–5.11)
RDW: 14.3 % (ref 11.5–15.5)
WBC: 6.2 10*3/uL (ref 4.0–10.5)

## 2015-03-25 LAB — COMPREHENSIVE METABOLIC PANEL
ALBUMIN: 3.5 g/dL (ref 3.5–5.0)
ALK PHOS: 84 U/L (ref 38–126)
ALT: 9 U/L — ABNORMAL LOW (ref 14–54)
AST: 22 U/L (ref 15–41)
Anion gap: 11 (ref 5–15)
BUN: 15 mg/dL (ref 6–20)
CO2: 22 mmol/L (ref 22–32)
CREATININE: 1.32 mg/dL — AB (ref 0.44–1.00)
Calcium: 9.1 mg/dL (ref 8.9–10.3)
Chloride: 102 mmol/L (ref 101–111)
GFR calc Af Amer: 41 mL/min — ABNORMAL LOW (ref >60)
GFR, EST NON AFRICAN AMERICAN: 35 mL/min — AB (ref >60)
GLUCOSE: 97 mg/dL (ref 65–99)
Potassium: 3.9 mmol/L (ref 3.5–5.1)
Sodium: 135 mmol/L (ref 135–145)
Total Bilirubin: 0.5 mg/dL (ref 0.3–1.2)
Total Protein: 6.8 g/dL (ref 6.5–8.1)

## 2015-03-25 LAB — PROTIME-INR
INR: 1.2 (ref 0.00–1.49)
PROTHROMBIN TIME: 15.3 s — AB (ref 11.6–15.2)

## 2015-03-25 LAB — TROPONIN I: Troponin I: 0.4 ng/mL — ABNORMAL HIGH (ref <0.031)

## 2015-03-25 LAB — MAGNESIUM: Magnesium: 1.9 mg/dL (ref 1.7–2.4)

## 2015-03-25 LAB — MRSA PCR SCREENING: MRSA BY PCR: NEGATIVE

## 2015-03-25 SURGERY — INVASIVE LAB ABORTED CASE

## 2015-03-25 MED ORDER — CARBIDOPA-LEVODOPA 25-100 MG PO TABS
1.0000 | ORAL_TABLET | Freq: Two times a day (BID) | ORAL | Status: DC
  Administered 2015-03-25 – 2015-03-27 (×4): 1 via ORAL
  Filled 2015-03-25 (×5): qty 1

## 2015-03-25 MED ORDER — ASPIRIN 81 MG PO CHEW
81.0000 mg | CHEWABLE_TABLET | Freq: Once | ORAL | Status: AC
  Administered 2015-03-25: 81 mg via ORAL
  Filled 2015-03-25: qty 1

## 2015-03-25 MED ORDER — HEPARIN (PORCINE) IN NACL 100-0.45 UNIT/ML-% IJ SOLN
750.0000 [IU]/h | INTRAMUSCULAR | Status: DC
  Administered 2015-03-25: 750 [IU]/h via INTRAVENOUS
  Filled 2015-03-25: qty 250

## 2015-03-25 MED ORDER — HYDROCODONE-ACETAMINOPHEN 7.5-325 MG PO TABS
1.0000 | ORAL_TABLET | Freq: Two times a day (BID) | ORAL | Status: DC | PRN
  Administered 2015-03-27: 02:00:00 1 via ORAL
  Filled 2015-03-25: qty 1

## 2015-03-25 MED ORDER — ASPIRIN EC 81 MG PO TBEC
81.0000 mg | DELAYED_RELEASE_TABLET | Freq: Every day | ORAL | Status: DC
  Administered 2015-03-26 – 2015-03-27 (×2): 81 mg via ORAL
  Filled 2015-03-25 (×2): qty 1

## 2015-03-25 MED ORDER — SODIUM CHLORIDE 0.9 % IJ SOLN: 3.0000 mL | INTRAMUSCULAR | Status: DC | PRN

## 2015-03-25 MED ORDER — FLUOXETINE HCL 20 MG PO CAPS
20.0000 mg | ORAL_CAPSULE | Freq: Every day | ORAL | Status: DC
  Administered 2015-03-26 – 2015-03-27 (×2): 20 mg via ORAL
  Filled 2015-03-25 (×2): qty 1

## 2015-03-25 MED ORDER — SODIUM CHLORIDE 0.9 % IV SOLN: 250.0000 mL | INTRAVENOUS | Status: DC | PRN

## 2015-03-25 MED ORDER — ACETAMINOPHEN 325 MG PO TABS
650.0000 mg | ORAL_TABLET | ORAL | Status: DC | PRN
  Administered 2015-03-27: 650 mg via ORAL
  Filled 2015-03-25: qty 2

## 2015-03-25 MED ORDER — VERAPAMIL HCL 2.5 MG/ML IV SOLN
INTRAVENOUS | Status: AC
  Filled 2015-03-25: qty 2

## 2015-03-25 MED ORDER — BUPROPION HCL ER (XL) 150 MG PO TB24
150.0000 mg | ORAL_TABLET | Freq: Every day | ORAL | Status: DC
  Administered 2015-03-26 – 2015-03-27 (×2): 150 mg via ORAL
  Filled 2015-03-25 (×2): qty 1

## 2015-03-25 MED ORDER — PANTOPRAZOLE SODIUM 40 MG PO TBEC
40.0000 mg | DELAYED_RELEASE_TABLET | Freq: Every day | ORAL | Status: DC
  Administered 2015-03-26 – 2015-03-27 (×2): 40 mg via ORAL
  Filled 2015-03-25 (×2): qty 1

## 2015-03-25 MED ORDER — ASPIRIN 81 MG PO CHEW
162.0000 mg | CHEWABLE_TABLET | Freq: Once | ORAL | Status: AC
  Administered 2015-03-25: 162 mg via ORAL
  Filled 2015-03-25: qty 2

## 2015-03-25 MED ORDER — LIDOCAINE HCL (PF) 1 % IJ SOLN
INTRAMUSCULAR | Status: AC
  Filled 2015-03-25: qty 30

## 2015-03-25 MED ORDER — NITROGLYCERIN 0.4 MG SL SUBL: 0.4000 mg | SUBLINGUAL_TABLET | SUBLINGUAL | Status: DC | PRN

## 2015-03-25 MED ORDER — ATORVASTATIN CALCIUM 40 MG PO TABS
40.0000 mg | ORAL_TABLET | Freq: Every day | ORAL | Status: DC
  Administered 2015-03-26: 22:00:00 40 mg via ORAL
  Filled 2015-03-25: qty 1

## 2015-03-25 MED ORDER — LEVOTHYROXINE SODIUM 88 MCG PO TABS
88.0000 ug | ORAL_TABLET | Freq: Every day | ORAL | Status: DC
  Administered 2015-03-26 – 2015-03-27 (×2): 88 ug via ORAL
  Filled 2015-03-25 (×3): qty 1

## 2015-03-25 MED ORDER — ALPRAZOLAM 0.25 MG PO TABS
0.2500 mg | ORAL_TABLET | Freq: Every day | ORAL | Status: DC
  Administered 2015-03-25 – 2015-03-26 (×2): 0.25 mg via ORAL
  Filled 2015-03-25 (×2): qty 1

## 2015-03-25 MED ORDER — SODIUM CHLORIDE 0.9 % IJ SOLN
3.0000 mL | Freq: Two times a day (BID) | INTRAMUSCULAR | Status: DC
  Administered 2015-03-26: 3 mL via INTRAVENOUS

## 2015-03-25 MED ORDER — ONDANSETRON HCL 4 MG/2ML IJ SOLN
4.0000 mg | Freq: Four times a day (QID) | INTRAMUSCULAR | Status: DC | PRN
Start: 2015-03-25 — End: 2015-03-27

## 2015-03-25 MED ORDER — NITROGLYCERIN 1 MG/10 ML FOR IR/CATH LAB
INTRA_ARTERIAL | Status: AC
  Filled 2015-03-25: qty 10

## 2015-03-25 NOTE — ED Notes (Signed)
Port X-ray at bedside.  

## 2015-03-25 NOTE — H&P (Signed)
History and Physical  Patient ID: MARGAUX ENGEN MRN: [id number], DOB: 1925-01-08 Date of Encounter: 03/25/2015, 5:48 PM Primary Physician: Jeri Modena Primary Cardiologist: Dr. Harl Bowie  Chief Complaint: chest pain x 1 week, dizziness today Reason for Admission: abnormal EKG, CP  HPI: Ms. Greenhalgh is an 81 y/o F with history of COPD, prior short tobacco hx, HTN, hypothyroidism, HLD, stroke (remote infarct seen on MRI, unknown to patient), mild cognitive impairment (started on Namenda 12/2014 by neurology), tremor who presented to [facility] upon urgent transfer from [place]. H/o neg nuc 09/2013 at [facility] (see Procedures->Echo scanned result from 6/1), 2D echo 01/2014 with severe LVH, EF 09-32%, diastolic dysfunction, mildly dilated LA, mod thickening of left coronary cusp, trace MR.  She was initially called a code STEMI but this was later called off due to lack of chest pain today and lack of reciprocal changes on EKG. Despite carrying a diagnosis of cognitive impairment she is actually a pretty good historian. She reports a long history of acid reflux. For the last week she has noticed intermittent chest discomfort, lasting no more than a few minutes at a time. It reminded her of prior indigestion. It has been relieved with taking a Tums, drinking Coca-Cola, and burping. She had had no exertional chest pain. No diaphoresis, nausea, vomiting, shortness of breath, palpitations or syncope. Today she says she had therapy come to the house and they took her vitals and something was abnormal so she was told to call her doctor. When her daughter called her doctor there was no availability so she was instructed to come to the ER instead. She had noticed some dizziness for the past 2 days. The patient thinks it was the blood pressure parameter that was running low but the paperwork from the [facility] ED reports the chief complaint of low HR. Lowest HR documented per [facility] paperwork was  47bpm, mostly 50s. EKG was found to be abnormal with some concavity of the inferior leads, possibly suggestive of inferior MI. Due to this, a code STEMI was called and she was transferred emergently to [facility]. Upon arrival, she reports no chest pain whatsoever today. She denies any other symptoms other than mild dizziness today. She says she would not have otherwise come to the hospital if it weren't for the recommendation of the person providing therapy. She is completely asymptomatic now. She denies any falls or syncope.  At [facility], Na 138, K 4.2, calcium 9.1, Cl 99, CO2 23.4, BUN 18, Cr 1.24, GFR 41, glucose 111, WBC 5.8, Hgb 12.0, Hct 36.6, plt 238. She received 81mg  ASA, 1/2 inch of nitro paste, heparin bolus + gtt, and 80mg  of atorvastatin. Prior Cr 0.99->1.40 in 2015.  Past Medical History  Diagnosis Date  . COPD (chronic obstructive pulmonary disease)   . DJD (degenerative joint disease)   . Mass of breast, right   . Skin neoplasm   . Hypertension   . Vertigo   . Restless legs   . Hypothyroidism   . Esophageal reflux   . Hyperlipidemia   . Hyperhidrosis   . Insomnia   . Depressive disorder   . Vitamin B 12 deficiency   . Allergic rhinitis   . Stroke     Remote infarct seen on head MRI  . Idiopathic thrombocytopenia purpura   . Rosacea   . Broken ankle   . Tremors of nervous system   . Gait difficulty   . Mild cognitive impairment     Started  on Namenda by neuro 12/2014     Most Recent Cardiac Studies: See above   Surgical History:  Past Surgical History  Procedure Laterality Date  . Right shoulder surgery      x3--rotator cuff  . Total abdominal hysterectomy       Home Meds: Prior to Admission medications   Medication Sig Start Date End Date Taking? Authorizing Provider  amLODipine (NORVASC) 5 MG tablet Take 1 tablet (5 mg total) by mouth daily. 08/10/14   Arnoldo Lenis, MD  buPROPion (WELLBUTRIN XL) 150 MG 24 hr tablet Take 150 mg by mouth  daily.    Historical Provider, MD  Calcium Carb-Cholecalciferol (CALCIUM 500 +D) 500-400 MG-UNIT TABS 1 tablet twice a day     Historical Provider, MD  carbidopa-levodopa (SINEMET) 25-100 MG per tablet Take 1 tablet by mouth 2 (two) times daily.      Historical Provider, MD  clonazePAM (KLONOPIN) 0.5 MG tablet 1/2 to one tab po bid 01/01/15   Marcial Pacas, MD  FLUoxetine (PROZAC) 20 MG capsule Take 20 mg by mouth daily.    Historical Provider, MD  furosemide (LASIX) 20 MG tablet Take 20 mg by mouth daily.    Historical Provider, MD  HYDROcodone-acetaminophen (NORCO) 7.5-325 MG per tablet Take 1 tablet by mouth 2 (two) times daily as needed for moderate pain.    Historical Provider, MD  labetalol (NORMODYNE) 200 MG tablet Take 200 mg by mouth 2 (two) times daily.    Historical Provider, MD  levothyroxine (SYNTHROID, LEVOTHROID) 88 MCG tablet Take 88 mcg by mouth daily before breakfast.    Historical Provider, MD  memantine (NAMENDA) 10 MG tablet Take 1 tablet (10 mg total) by mouth 2 (two) times daily. 01/15/15   Marcial Pacas, MD  omeprazole (PRILOSEC) 20 MG capsule Take 20 mg by mouth daily.      Historical Provider, MD    Allergies:  Allergies  Allergen Reactions  . Codeine     History   Social History  . Marital Status: Married    Spouse Name: N/A  . Number of Children: 1  . Years of Education: 7th grade   Occupational History  . retired    Social History Main Topics  . Smoking status: Former Smoker -- 1.00 packs/day for 3 years    Types: Cigarettes    Start date: 06/14/1944    Quit date: 08/24/1996  . Smokeless tobacco: Never Used  . Alcohol Use: No  . Drug Use: No  . Sexual Activity: Not on file   Other Topics Concern  . Not on file   Social History Narrative   Lives at home with daughter.   Right-handed.   Occasional caffeine use.     Family History  Problem Relation Age of Onset  . Heart disease Mother   . Heart disease Father   . Dementia Mother   . Stroke Father      Review of Systems:No bleeding. All other systems reviewed and are otherwise negative except as noted above.  Labs: At [facility], Na 138, K 4.2, calcium 9.1, Cl 99, CO2 23.4, BUN 18, Cr 1.24, GFR 41, glucose 111, WBC 5.8, Hgb 12.0, Hct 36.6, plt 238.   Radiology/Studies:  No results found. Wt Readings from Last 3 Encounters:  01/15/15 132 lb (59.875 kg)  12/12/14 137 lb (62.143 kg)  08/10/14 138 lb (62.596 kg)    EKG:  [facility] EKG: ectopic atrial rhythm 54bpm, concavity is noted in the inferior leads but difficult to  assess if true ST elevation. TWI is present in I, avL (known from prior tracings), nonspecific ST/T change in V2 (prior EKGs from [facility] in 12/2014 and 01/2015 show TWI in this lead), QTc 563ms F/u tracing here: ectopic atrial rhythm 59bpm, short PR interval 56ms, concavity noted again in III, avF. TWI I and avL (again, known from prior), nonspecific ST/T change in V2 (prior EKGs from [facility] in 12/2014 and 01/2015 show TWI in this lead), QTc 628ms - Both tracings do show inferior Q waves  Physical Exam: Blood pressure 135/63, pulse 57, temperature 97.8 F (36.6 C), temperature source Oral, resp. rate 22, SpO2 100 %. General: Well developed, well nourished WF in no acute distress. Head: Normocephalic, atraumatic, sclera non-icteric, no xanthomas, nares are without discharge.  Neck: Negative for carotid bruits. JVD not elevated. Lungs: Clear bilaterally to auscultation without wheezes, rales, or rhonchi. Breathing is unlabored. Heart: RRR with S1 S2. No murmurs, rubs, or gallops appreciated. Abdomen: Soft, non-tender, non-distended with normoactive bowel sounds. No hepatomegaly. No rebound/guarding. No obvious abdominal masses. Msk:  Strength and tone appear normal for age. Extremities: No clubbing or cyanosis. No edema.  Distal pedal pulses are 2+ and equal bilaterally. Neuro: Alert and oriented to self, place, August 2016, does not know day of the month but says she  wouldn't typically know this anyway. No focal deficit. No facial asymmetry. Moves all extremities spontaneously. Psych:  Responds to questions appropriately with a normal affect.    ASSESSMENT AND PLAN:   1. Dizziness/possible reported low HR with ectopic atrial rhythm in the ER 2. Prolonged QT interval 3. Recent chest discomfort with abnormal EKG - first troponin 0.40 4. HTN 5. Hyperlipidemia 6. H/o chronic diastolic CHF, appearing euvolemic on exam 7. Mild renal insufficiency - probable CKD stage III 8. GERD  The patient presents with mild dizziness for the last 2 days. She has had intermittent chest pain over the last week similar to her prior GERD, relieved with Tums, Coca-Cola and burping. Code STEMI was called off due to lack of chest pain today. EKG does show nonspecific changes - reviewed by MD as well. Although she is presently asymptomatic, cannot rule out underlying ACS. There was report of possible slow HR noted by her therapist today. EKG does show ectopic atrial rhythm. HR in the 50s in the ER. We will admit and cycle troponins. Continue heparin per pharmacy for now, pending troponin trend. Continue aspirin. Will hold BB for now until we see what she does on telemetry. Check lytes given QT prolongation. CXR suggests patchy atx versus PNA but she is not complaining of any symptoms to suggest PNA and she is afebrile. Check echo.  The plan for cath will depend on trajectory of enzymes - will keep NPO after midnight.   Code status clarified during discussion between Dr. Ellyn Hack and patient as full code.  SignedMelina Copa PA-C 03/25/2015, 5:48 PM Pager: [phone]  I have seen, examined and evaluated the patient this PM along with Melina Copa, PA-C.  After reviewing all the available data and chart,  I agree with her findings, examination as well as impression recommendations.  Very atypical presentation for what was called as a Code STEMI.  She is not even sure as to why she was  sent tot he ER - by the physical therapist.  She did descibe having a sharp stabbing pain under her left rib cage over the past few days, but does not note any true precordial type (anginal) chest pain.  No CHF symptoms or any symptoms to suggest an arrhythmia.  Her EKG is abnormal, nut has more of the appearance of a completed (Q's out) inferior MI -- it is quite interesting to see that the 1st Troponin level is mildly elevated in the absence of any ACS type symptoms.   At this point - we will continue to cycle biomarkers & start on IV Heparin.  Check Echo in the AM to assess for any obvious WMA.  She would be amenable to cardiac catheterization if it is deemed indicated, but for now, I am not sure that this would benefit her -- this will be determined based upon Troponin trend & Echo findings.    No BB due to bradycardia. No CHF SSx & appears to be euvolemic.    Monitor overnight & reassess in the AM as more data is available.   Leonie Man, M.D., M.S. Interventional Cardiologist   Pager # [phone]

## 2015-03-25 NOTE — ED Notes (Signed)
Pt reports to the ED for eval of possible STEMI. She was sent here for [facility] because they found her to be hypotensive and bradycardic. She denies any CP at this time or today but reports she has been having it off and on for the past couple of days. Her only complaint is dizziness. Denies any diaphoresis or SOB. PTA she had 81 mg ASA, 80 mg atorvastatin, 4,000 mg bolus of heparin, and 1 in of nitro paste. Cardiology PA at bedside at this time. Pt A&Ox4, resp e/u, and skin warm and dry.

## 2015-03-25 NOTE — ED Provider Notes (Signed)
Transfer from [place] as Code STEMI. Cardiology at bedside. Repeat 12-lead in ED here is non diagnostic. Pt currently denies any pain. On heparin gtt.   Virgel Manifold, MD 03/25/15 [phone]

## 2015-03-25 NOTE — Progress Notes (Signed)
ANTICOAGULATION CONSULT NOTE - Initial Consult  Pharmacy Consult for Heparin Indication: chest pain/ACS  Allergies  Allergen Reactions  . Codeine     Patient Measurements:   Heparin Dosing Weight:   Vital Signs: Temp: 97.8 F (36.6 C) (08/01 1738) Temp Source: Oral (08/01 1738) BP: 159/96 mmHg (08/01 1738) Pulse Rate: 59 (08/01 1738)  Labs: No results for input(s): HGB, HCT, PLT, APTT, LABPROT, INR, HEPARINUNFRC, CREATININE, CKTOTAL, CKMB, TROPONINI in the last 72 hours.  CrCl cannot be calculated (Unknown ideal weight.).   Medical History: Past Medical History  Diagnosis Date  . COPD (chronic obstructive pulmonary disease)   . DJD (degenerative joint disease)   . Mass of breast, right   . Skin neoplasm   . Hypertension   . Vertigo   . Restless legs   . Hypothyroidism   . Esophageal reflux   . Hyperlipidemia   . Hyperhidrosis   . Insomnia   . Depressive disorder   . Vitamin B 12 deficiency   . Allergic rhinitis   . Stroke     Remote infarct seen on head MRI  . Idiopathic thrombocytopenia purpura   . Rosacea   . Broken ankle   . Tremors of nervous system   . Gait difficulty   . Mild cognitive impairment     Started on Namenda by neuro 12/2014    Medications:   (Not in a hospital admission) Scheduled:  Infusions:   Assessment: 81yo female presents from [place] as Code STEMI. Pharmacy is consulted to dose heparin for ACS/chest pain. Hgb 11.7, Plt 247, sCr 1.3, Trop 0.4.  Pt was given heparin 4000 unit bolus followed by 1092 units/hr (~18 units/kg/hr). Will reduce rate to 12 units/kg/hr as is recommended for ACS/CP.  Goal of Therapy:  Heparin level 0.3-0.7 units/ml Monitor platelets by anticoagulation protocol: Yes   Plan:  Start heparin infusion at 750 units/hr Check anti-Xa level in 8 hours and daily while on heparin Continue to monitor H&H and platelets  Andrey Cota. Diona Foley, PharmD Clinical Pharmacist Pager [phone] 03/25/2015,5:53 PM

## 2015-03-25 NOTE — ED Notes (Signed)
Zoll applied to patient. Cardiology PA at bedside. States we are not taking the patient to cath lab at this time.

## 2015-03-25 NOTE — ED Notes (Signed)
Daughter wishes to be updated with any changes in patient status [facility] [phone]

## 2015-03-26 ENCOUNTER — Encounter (HOSPITAL_COMMUNITY)
Admission: EM | Disposition: A | Discharge status: Home / Self Care | Payer: Self-pay | Source: Home / Self Care | Attending: Cardiology

## 2015-03-26 ENCOUNTER — Ambulatory Visit (HOSPITAL_COMMUNITY): Admission: AD | Admit: 2015-03-26 | Payer: Medicare Other | Source: Ambulatory Visit | Admitting: Cardiology

## 2015-03-26 DIAGNOSIS — I2510 Atherosclerotic heart disease of native coronary artery without angina pectoris: Secondary | ICD-10-CM

## 2015-03-26 DIAGNOSIS — I214 Non-ST elevation (NSTEMI) myocardial infarction: Secondary | ICD-10-CM

## 2015-03-26 DIAGNOSIS — R072 Precordial pain: Secondary | ICD-10-CM

## 2015-03-26 HISTORY — PX: CARDIAC CATHETERIZATION: SHX172

## 2015-03-26 LAB — LIPID PANEL
CHOLESTEROL: 218 mg/dL — AB (ref 0–200)
HDL: 49 mg/dL (ref >40)
LDL CALC: 145 mg/dL — AB (ref 0–99)
Total CHOL/HDL Ratio: 4.4 RATIO
Triglycerides: 122 mg/dL (ref <150)
VLDL: 24 mg/dL (ref 0–40)

## 2015-03-26 LAB — BASIC METABOLIC PANEL
Anion gap: 10 (ref 5–15)
BUN: 14 mg/dL (ref 6–20)
CO2: 23 mmol/L (ref 22–32)
CREATININE: 1.23 mg/dL — AB (ref 0.44–1.00)
Calcium: 8.9 mg/dL (ref 8.9–10.3)
Chloride: 104 mmol/L (ref 101–111)
GFR calc non Af Amer: 38 mL/min — ABNORMAL LOW (ref >60)
GFR, EST AFRICAN AMERICAN: 44 mL/min — AB (ref >60)
GLUCOSE: 93 mg/dL (ref 65–99)
Potassium: 3.7 mmol/L (ref 3.5–5.1)
SODIUM: 137 mmol/L (ref 135–145)

## 2015-03-26 LAB — CBC
HCT: 35.3 % — ABNORMAL LOW (ref 36.0–46.0)
Hemoglobin: 11.3 g/dL — ABNORMAL LOW (ref 12.0–15.0)
MCH: 30.1 pg (ref 26.0–34.0)
MCHC: 32 g/dL (ref 30.0–36.0)
MCV: 94.1 fL (ref 78.0–100.0)
Platelets: 226 10*3/uL (ref 150–400)
RBC: 3.75 MIL/uL — ABNORMAL LOW (ref 3.87–5.11)
RDW: 14.5 % (ref 11.5–15.5)
WBC: 4 10*3/uL (ref 4.0–10.5)

## 2015-03-26 LAB — T4, FREE: Free T4: 1.02 ng/dL (ref 0.61–1.12)

## 2015-03-26 LAB — TROPONIN I
Troponin I: 0.42 ng/mL — ABNORMAL HIGH (ref <0.031)
Troponin I: 0.49 ng/mL — ABNORMAL HIGH (ref <0.031)

## 2015-03-26 LAB — POCT ACTIVATED CLOTTING TIME: ACTIVATED CLOTTING TIME: 540 s

## 2015-03-26 LAB — TSH: TSH: 3.123 u[IU]/mL (ref 0.350–4.500)

## 2015-03-26 LAB — HEPARIN LEVEL (UNFRACTIONATED)
HEPARIN UNFRACTIONATED: 0.51 [IU]/mL (ref 0.30–0.70)
HEPARIN UNFRACTIONATED: 0.58 [IU]/mL (ref 0.30–0.70)

## 2015-03-26 SURGERY — LEFT HEART CATH AND CORONARY ANGIOGRAPHY
Anesthesia: LOCAL

## 2015-03-26 MED ORDER — IOHEXOL 350 MG/ML SOLN
INTRAVENOUS | Status: DC | PRN
  Administered 2015-03-26: 105 mL via INTRACARDIAC

## 2015-03-26 MED ORDER — HEPARIN (PORCINE) IN NACL 2-0.9 UNIT/ML-% IJ SOLN
INTRAMUSCULAR | Status: AC
  Filled 2015-03-26: qty 1000

## 2015-03-26 MED ORDER — LIDOCAINE HCL (PF) 1 % IJ SOLN
INTRAMUSCULAR | Status: AC
  Filled 2015-03-26: qty 30

## 2015-03-26 MED ORDER — ALUM & MAG HYDROXIDE-SIMETH 200-200-20 MG/5ML PO SUSP
30.0000 mL | ORAL | Status: DC | PRN
  Administered 2015-03-26: 30 mL via ORAL
  Filled 2015-03-26: qty 30

## 2015-03-26 MED ORDER — HEPARIN SODIUM (PORCINE) 1000 UNIT/ML IJ SOLN
INTRAMUSCULAR | Status: AC
  Filled 2015-03-26: qty 1

## 2015-03-26 MED ORDER — BIVALIRUDIN 250 MG IV SOLR
250.0000 mg | INTRAVENOUS | Status: DC | PRN
  Administered 2015-03-26: 1.75 mg/kg/h via INTRAVENOUS

## 2015-03-26 MED ORDER — SODIUM CHLORIDE 0.9 % IJ SOLN: 3.0000 mL | INTRAMUSCULAR | Status: DC | PRN

## 2015-03-26 MED ORDER — NITROGLYCERIN 1 MG/10 ML FOR IR/CATH LAB
INTRA_ARTERIAL | Status: DC | PRN
  Administered 2015-03-26: 200 ug via INTRACORONARY

## 2015-03-26 MED ORDER — MIDAZOLAM HCL 2 MG/2ML IJ SOLN
INTRAMUSCULAR | Status: DC | PRN
  Administered 2015-03-26 (×2): 1 mg via INTRAVENOUS

## 2015-03-26 MED ORDER — SODIUM CHLORIDE 0.9 % IJ SOLN
3.0000 mL | Freq: Two times a day (BID) | INTRAMUSCULAR | Status: DC
  Administered 2015-03-26: 3 mL via INTRAVENOUS

## 2015-03-26 MED ORDER — ASPIRIN 81 MG PO CHEW: 81.0000 mg | CHEWABLE_TABLET | ORAL | Status: DC

## 2015-03-26 MED ORDER — SODIUM CHLORIDE 0.9 % IV SOLN
INTRAVENOUS | Status: DC | PRN
  Administered 2015-03-26: 50 mL/h via INTRAVENOUS

## 2015-03-26 MED ORDER — VERAPAMIL HCL 2.5 MG/ML IV SOLN
INTRAVENOUS | Status: DC | PRN
  Administered 2015-03-26: 14:00:00 via INTRA_ARTERIAL

## 2015-03-26 MED ORDER — VERAPAMIL HCL 2.5 MG/ML IV SOLN
INTRAVENOUS | Status: AC
  Filled 2015-03-26: qty 2

## 2015-03-26 MED ORDER — SODIUM CHLORIDE 0.9 % WEIGHT BASED INFUSION: 3.0000 mL/kg/h | INTRAVENOUS | Status: AC

## 2015-03-26 MED ORDER — BIVALIRUDIN BOLUS VIA INFUSION - CUPID
INTRAVENOUS | Status: DC | PRN
  Administered 2015-03-26: 42.75 mg via INTRAVENOUS

## 2015-03-26 MED ORDER — FENTANYL CITRATE (PF) 100 MCG/2ML IJ SOLN
INTRAMUSCULAR | Status: DC | PRN
  Administered 2015-03-26 (×2): 25 ug via INTRAVENOUS

## 2015-03-26 MED ORDER — SODIUM CHLORIDE 0.9 % IV SOLN: 250.0000 mL | INTRAVENOUS | Status: DC | PRN

## 2015-03-26 MED ORDER — ANGIOPLASTY BOOK
Freq: Once | Status: AC
  Administered 2015-03-26: 21:00:00
  Filled 2015-03-26: qty 1

## 2015-03-26 MED ORDER — HEPARIN SODIUM (PORCINE) 1000 UNIT/ML IJ SOLN
INTRAMUSCULAR | Status: DC | PRN
  Administered 2015-03-26: 3000 [IU] via INTRAVENOUS

## 2015-03-26 MED ORDER — AMLODIPINE BESYLATE 10 MG PO TABS
10.0000 mg | ORAL_TABLET | Freq: Every day | ORAL | Status: DC
  Administered 2015-03-26 – 2015-03-27 (×2): 10 mg via ORAL
  Filled 2015-03-26 (×2): qty 1

## 2015-03-26 MED ORDER — TICAGRELOR 90 MG PO TABS
ORAL_TABLET | ORAL | Status: DC | PRN
  Administered 2015-03-26: 180 mg via ORAL

## 2015-03-26 MED ORDER — SODIUM CHLORIDE 0.9 % IV SOLN
INTRAVENOUS | Status: DC
Start: 2015-03-26 — End: 2015-03-26
  Administered 2015-03-26: 10:00:00 via INTRAVENOUS

## 2015-03-26 MED ORDER — ALPRAZOLAM 0.25 MG PO TABS
0.2500 mg | ORAL_TABLET | Freq: Three times a day (TID) | ORAL | Status: DC | PRN
  Administered 2015-03-26: 0.25 mg via ORAL
  Filled 2015-03-26 (×2): qty 1

## 2015-03-26 MED ORDER — TICAGRELOR 90 MG PO TABS
ORAL_TABLET | ORAL | Status: AC
  Filled 2015-03-26: qty 2

## 2015-03-26 MED ORDER — LIDOCAINE HCL (PF) 1 % IJ SOLN
INTRAMUSCULAR | Status: DC | PRN
  Administered 2015-03-26: 5 mL via SUBCUTANEOUS

## 2015-03-26 MED ORDER — SODIUM CHLORIDE 0.9 % IJ SOLN: 3.0000 mL | Freq: Two times a day (BID) | INTRAMUSCULAR | Status: DC

## 2015-03-26 MED ORDER — NITROGLYCERIN 1 MG/10 ML FOR IR/CATH LAB
INTRA_ARTERIAL | Status: AC
  Filled 2015-03-26: qty 10

## 2015-03-26 MED ORDER — FENTANYL CITRATE (PF) 100 MCG/2ML IJ SOLN
INTRAMUSCULAR | Status: AC
  Filled 2015-03-26: qty 4

## 2015-03-26 MED ORDER — MIDAZOLAM HCL 2 MG/2ML IJ SOLN
INTRAMUSCULAR | Status: AC
  Filled 2015-03-26: qty 4

## 2015-03-26 MED ORDER — NITROGLYCERIN 1 MG/10 ML FOR IR/CATH LAB
INTRA_ARTERIAL | Status: DC | PRN
  Administered 2015-03-26: 15:00:00

## 2015-03-26 MED ORDER — TICAGRELOR 90 MG PO TABS
90.0000 mg | ORAL_TABLET | Freq: Two times a day (BID) | ORAL | Status: DC
  Administered 2015-03-27 (×2): 90 mg via ORAL
  Filled 2015-03-26: qty 1

## 2015-03-26 SURGICAL SUPPLY — 20 items
BALLN EUPHORA RX 2.0X12 (BALLOONS) ×2
BALLN ~~LOC~~ EUPHORA RX 3.0X20 (BALLOONS) ×2
BALLOON EUPHORA RX 2.0X12 (BALLOONS) IMPLANT
BALLOON ~~LOC~~ EUPHORA RX 3.0X20 (BALLOONS) IMPLANT
CATH INFINITI 5 FR JL3.5 (CATHETERS) ×2 IMPLANT
CATH INFINITI 5FR ANG PIGTAIL (CATHETERS) ×2 IMPLANT
CATH INFINITI JR4 5F (CATHETERS) ×2 IMPLANT
DEVICE RAD COMP TR BAND LRG (VASCULAR PRODUCTS) ×2 IMPLANT
GLIDESHEATH SLEND SS 6F .021 (SHEATH) ×2 IMPLANT
GUIDE CATH RUNWAY 6FR AL 75 (CATHETERS) ×1 IMPLANT
KIT ENCORE 26 ADVANTAGE (KITS) ×1 IMPLANT
KIT HEART LEFT (KITS) ×2 IMPLANT
PACK CARDIAC CATHETERIZATION (CUSTOM PROCEDURE TRAY) ×2 IMPLANT
STENT SYNERGY DES 2.5X24 (Permanent Stent) ×1 IMPLANT
SYR MEDRAD MARK V 150ML (SYRINGE) ×2 IMPLANT
TRANSDUCER W/STOPCOCK (MISCELLANEOUS) ×2 IMPLANT
TUBING CIL FLEX 10 FLL-RA (TUBING) ×2 IMPLANT
WIRE ASAHI PROWATER 180CM (WIRE) ×1 IMPLANT
WIRE HI TORQ VERSACORE-J 145CM (WIRE) ×1 IMPLANT
WIRE SAFE-T 1.5MM-J .035X260CM (WIRE) ×2 IMPLANT

## 2015-03-26 NOTE — H&P (View-Only) (Signed)
Patient Name: Tamara Hart Date of Encounter: 03/26/2015  Primary Cardiologist: Dr. Harl Bowie   Active Problems:   Ectopic atrial rhythm   Prolonged Q-T interval on ECG   Chest pain   Essential hypertension   Hyperlipidemia   Chronic diastolic CHF (congestive heart failure)   CKD (chronic kidney disease), stage III   GERD (gastroesophageal reflux disease)    SUBJECTIVE  Denies any CP this morning, but states she had L sided chest discomfort intermittent for the past 2 weeks, no exacerbation factors, occuring both at rest and with exertion lasting a min at a time. Denies any recent LE swelling or SOB.   CURRENT MEDS . ALPRAZolam  0.25 mg Oral QHS  . aspirin EC  81 mg Oral Daily  . atorvastatin  40 mg Oral q1800  . buPROPion  150 mg Oral Daily  . carbidopa-levodopa  1 tablet Oral BID  . FLUoxetine  20 mg Oral Daily  . levothyroxine  88 mcg Oral QAC breakfast  . pantoprazole  40 mg Oral Daily  . sodium chloride  3 mL Intravenous Q12H    OBJECTIVE  Filed Vitals:   03/25/15 2133 03/25/15 2139 03/25/15 2344 03/26/15 0422  BP:  141/38 140/42 120/53  Pulse:  60 59 67  Temp:  98.2 F (36.8 C) 98.6 F (37 C) 97.9 F (36.6 C)  TempSrc:  Oral Oral Oral  Resp:  19 19 21   Height: 5\' 1"  (1.549 m)     Weight: 125 lb 10.6 oz (57 kg)     SpO2:  96% 96% 95%    Intake/Output Summary (Last 24 hours) at 03/26/15 0838 Last data filed at 03/26/15 0424  Gross per 24 hour  Intake 317.75 ml  Output    550 ml  Net -232.25 ml   Filed Weights   03/25/15 2133  Weight: 125 lb 10.6 oz (57 kg)    PHYSICAL EXAM  General: Pleasant, NAD. Neuro: Alert and oriented X 3. Moves all extremities spontaneously. Psych: Normal affect. HEENT:  Normal  Neck: Supple without bruits or JVD. Lungs:  Resp regular and unlabored, CTA. Heart: RRR no s3, s4, or murmurs. Abdomen: Soft, non-tender, non-distended, BS + x 4.  Extremities: No clubbing, cyanosis or edema. DP/PT/Radials 2+ and equal  bilaterally.  Accessory Clinical Findings  CBC  Recent Labs  03/25/15 1822 03/26/15 0636  WBC 6.2 4.0  HGB 11.7* 11.3*  HCT 35.4* 35.3*  MCV 92.2 94.1  PLT 247 016   Basic Metabolic Panel  Recent Labs  03/25/15 1822 03/26/15 0636  NA 135 137  K 3.9 3.7  CL 102 104  CO2 22 23  GLUCOSE 97 93  BUN 15 14  CREATININE 1.32* 1.23*  CALCIUM 9.1 8.9  MG 1.9  --    Liver Function Tests  Recent Labs  03/25/15 1822  AST 22  ALT 9*  ALKPHOS 84  BILITOT 0.5  PROT 6.8  ALBUMIN 3.5   Cardiac Enzymes  Recent Labs  03/25/15 1822 03/25/15 2334 03/26/15 0636  TROPONINI 0.40* 0.49* 0.42*   Fasting Lipid Panel  Recent Labs  03/26/15 0636  CHOL 218*  HDL 49  LDLCALC 145*  TRIG 122  CHOLHDL 4.4   Thyroid Function Tests  Recent Labs  03/25/15 2334  TSH 3.123    TELE NSR with HR 50s    ECG  NSR with TWI in lead I and AVL unchanged when compare to previous EKG from Oct 2015  Echocardiogram  pending  Radiology/Studies  Dg Chest Port 1 View  03/25/2015   CLINICAL DATA:  Dizziness and left chest pain.  EXAM: PORTABLE CHEST - 1 VIEW  COMPARISON:  Portable chest and chest CT dated 01/13/2015.  FINDINGS: Interval mildly enlarged cardiac silhouette. Interval mild patchy opacity at the left lung base. Mild scoliosis.  IMPRESSION: Mild patchy atelectasis or pneumonia at the left lung base and mild cardiomegaly.   Electronically Signed   By: Claudie Revering M.D.   On: 03/25/2015 18:22    ASSESSMENT AND PLAN   1. Dizziness/possible reported low HR with ectopic atrial rhythm in the ER  2. Prolonged QT interval  3. Recent chest discomfort with abnormal EKG - first troponin 0.40  - originally transferred from [facility] as Code STEMI, however cancelled as she denies any CP. For the past few days she has some sharp stabbing pain under her L rib  - trop 0.40 --> 0.49 --> 0.42  - pending echo. Will discuss with MD regarding ischemic workup, sx atypical, however  serial trop does not have classic ACS trend however cannot explain the degree of elevation trop   4. HTN 5. Hyperlipidemia 6. H/o chronic diastolic CHF, appearing euvolemic on exam 7. Mild renal insufficiency - probable CKD stage III 8. GERD  Signed, Woodward Ku Pager: [phone]  I have personally seen and examined this patient with Almyra Deforest, PA-C. I agree with the assessment and plan as outlined above. She has had chest pain on/off for two weeks. This is with exertion but many times also at rest and with deep breaths. Her troponin is elevated. Will plan cardiac cath today to exclude obstructive CAD. Risks and benefits reviewed with pt. She agrees to proceed.   Tabari Volkert 03/26/2015 9:23 AM

## 2015-03-26 NOTE — Progress Notes (Signed)
ANTICOAGULATION CONSULT NOTE - Follow Up Consult  Pharmacy Consult for heparin Indication: chest pain/ACS   Labs:  Recent Labs  03/25/15 1822 03/25/15 2334 03/26/15 0139  HGB 11.7*  --   --   HCT 35.4*  --   --   PLT 247  --   --   LABPROT 15.3*  --   --   INR 1.20  --   --   HEPARINUNFRC  --   --  0.51  CREATININE 1.32*  --   --   TROPONINI 0.40* 0.49*  --      Assessment/Plan:  81yo female therapeutic on heparin with initial dosing for CP. Will continue gtt at current rate and confirm stable with additional level.   Wynona Neat, PharmD, BCPS  03/26/2015,2:57 AM

## 2015-03-26 NOTE — Progress Notes (Signed)
Utilization review completed.  

## 2015-03-26 NOTE — Interval H&P Note (Signed)
History and Physical Interval Note:  03/26/2015 2:03 PM  Tamara Hart  has presented today for surgery, with the diagnosis of c/p  The various methods of treatment have been discussed with the patient and family. After consideration of risks, benefits and other options for treatment, the patient has consented to  Procedure(s): Left Heart Cath and Coronary Angiography (N/A) as a surgical intervention .  The patient's history has been reviewed, patient examined, no change in status, stable for surgery.  I have reviewed the patient's chart and labs.  Questions were answered to the patient's satisfaction.   Cath Lab Visit (complete for each Cath Lab visit)  Clinical Evaluation Leading to the Procedure:   ACS: Yes.    Non-ACS:    Anginal Classification: CCS III  Anti-ischemic medical therapy: Maximal Therapy (2 or more classes of medications)  Non-Invasive Test Results: No non-invasive testing performed  Prior CABG: No previous CABG        Collier Salina [facility] 03/26/2015 2:03 PM

## 2015-03-26 NOTE — Progress Notes (Signed)
TR BAND REMOVAL  LOCATION:    right radial  DEFLATED PER PROTOCOL:    Yes.    TIME BAND OFF / DRESSING APPLIED:    1915   SITE UPON ARRIVAL:    Level 0  SITE AFTER BAND REMOVAL:    Level 0   CIRCULATION SENSATION AND MOVEMENT:    Within Normal Limits   Yes.

## 2015-03-26 NOTE — Progress Notes (Addendum)
ANTICOAGULATION CONSULT NOTE - Follow up Pharmacy Consult for Heparin Indication: chest pain/ACS  Allergies  Allergen Reactions  . Namenda [Memantine] Anxiety and Other (See Comments)    Very agitated and anxiety/panic attacks  . Codeine Other (See Comments)    unknown    Patient Measurements: Height: 5\' 1"  (154.9 cm) Weight: 125 lb 10.6 oz (57 kg) IBW/kg (Calculated) : 47.8 Heparin Dosing Weight:   Vital Signs: Temp: 97.9 F (36.6 C) (08/02 0808) Temp Source: Oral (08/02 0808) BP: 121/54 mmHg (08/02 1011) Pulse Rate: 58 (08/02 1011)  Labs:  Recent Labs  03/25/15 1822 03/25/15 2334 03/26/15 0139 03/26/15 0636  HGB 11.7*  --   --  11.3*  HCT 35.4*  --   --  35.3*  PLT 247  --   --  226  LABPROT 15.3*  --   --   --   INR 1.20  --   --   --   HEPARINUNFRC  --   --  0.51 0.58  CREATININE 1.32*  --   --  1.23*  TROPONINI 0.40* 0.49*  --  0.42*    Estimated Creatinine Clearance: 24.3 mL/min (by C-G formula based on Cr of 1.23).   Medical History: Past Medical History  Diagnosis Date  . COPD (chronic obstructive pulmonary disease)   . Mass of breast, right   . Skin neoplasm     "precancers"  . Hypertension   . Vertigo   . Restless legs   . Hypothyroidism   . Esophageal reflux   . Hyperlipidemia   . Hyperhidrosis   . Insomnia   . Depressive disorder   . Vitamin B 12 deficiency   . Allergic rhinitis   . Stroke     Remote infarct seen on head MRI  . Idiopathic thrombocytopenia purpura   . Rosacea   . Broken ankle     "right; no OR"  . Tremors of nervous system   . Gait difficulty   . Mild cognitive impairment     Started on Namenda by neuro 12/2014  . Myocardial infarction     "maybe today" (03/25/2015)  . On home oxygen therapy     "I've got it; don't have to use it" (03/25/2015)  . Pneumonia X 2  . DJD (degenerative joint disease)   . Arthritis     "all over me"  . Chronic lower back pain   . Anxiety     Medications:  Prescriptions prior to  admission  Medication Sig Dispense Refill Last Dose  . ALPRAZolam (XANAX) 0.5 MG tablet Take 0.25 mg by mouth at bedtime.   03/24/2015 at Unknown time  . buPROPion (WELLBUTRIN XL) 150 MG 24 hr tablet Take 150 mg by mouth daily.   03/25/2015 at Unknown time  . carbidopa-levodopa (SINEMET) 25-100 MG per tablet Take 1 tablet by mouth 2 (two) times daily.     03/25/2015 at 0930  . FLUoxetine (PROZAC) 20 MG capsule Take 20 mg by mouth daily.   03/25/2015 at Unknown time  . furosemide (LASIX) 20 MG tablet Take 20 mg by mouth daily.   03/25/2015 at Unknown time  . HYDROcodone-acetaminophen (NORCO) 7.5-325 MG per tablet Take 1 tablet by mouth 2 (two) times daily as needed for moderate pain.   03/25/2015 at Unknown time  . labetalol (NORMODYNE) 200 MG tablet Take 200 mg by mouth 2 (two) times daily.   03/25/2015 at 0930  . levothyroxine (SYNTHROID, LEVOTHROID) 88 MCG tablet Take 88 mcg by mouth daily  before breakfast.   03/25/2015 at Unknown time  . omeprazole (PRILOSEC) 20 MG capsule Take 20 mg by mouth daily.     03/25/2015 at Unknown time  . amLODipine (NORVASC) 5 MG tablet Take 1 tablet (5 mg total) by mouth daily. 30 tablet 6 Taking  . clonazePAM (KLONOPIN) 0.5 MG tablet 1/2 to one tab po bid 60 tablet 1 Taking  . memantine (NAMENDA) 10 MG tablet Take 1 tablet (10 mg total) by mouth 2 (two) times daily. 60 tablet 11    Scheduled:  Infusions:   Assessment: 81yo female presented to [facility] on 03/25/15 from [place] as Code STEMI. Code STEMI cancelled as pt denied any CP. Continues on IV heparin per pharmacy protocol for ACS/chest pain.  Today's AM heparin level is 0.58, confirming level remains therapeutic on 750 units/hr heparin IV infusion for ACS/CP. H/H remains low/stable at 11.3/35.3 and PLTC is 226 wnl.  No bleeding noted.   Cardiologist noted patient has had CP off/on for 2 weeks with exertion or at rest, troponin elevated; plans for cardiac cath today to exclude obstructive CAD.   Goal of Therapy:  Heparin level  0.3-0.7 units/ml Monitor platelets by anticoagulation protocol: Yes   Plan:  Continue heparin drip at 750 units/hr Daily heparin level and CBC F/u post cardiac cath today.    Nicole Cella, RPh Clinical Pharmacist Pager: [phone] 03/26/2015,10:29 AM

## 2015-03-26 NOTE — Progress Notes (Signed)
Patient Name: Tamara Hart Date of Encounter: 03/26/2015  Primary Cardiologist: Dr. Harl Bowie   Active Problems:   Ectopic atrial rhythm   Prolonged Q-T interval on ECG   Chest pain   Essential hypertension   Hyperlipidemia   Chronic diastolic CHF (congestive heart failure)   CKD (chronic kidney disease), stage III   GERD (gastroesophageal reflux disease)    SUBJECTIVE  Denies any CP this morning, but states she had L sided chest discomfort intermittent for the past 2 weeks, no exacerbation factors, occuring both at rest and with exertion lasting a min at a time. Denies any recent LE swelling or SOB.   CURRENT MEDS . ALPRAZolam  0.25 mg Oral QHS  . aspirin EC  81 mg Oral Daily  . atorvastatin  40 mg Oral q1800  . buPROPion  150 mg Oral Daily  . carbidopa-levodopa  1 tablet Oral BID  . FLUoxetine  20 mg Oral Daily  . levothyroxine  88 mcg Oral QAC breakfast  . pantoprazole  40 mg Oral Daily  . sodium chloride  3 mL Intravenous Q12H    OBJECTIVE  Filed Vitals:   03/25/15 2133 03/25/15 2139 03/25/15 2344 03/26/15 0422  BP:  141/38 140/42 120/53  Pulse:  60 59 67  Temp:  98.2 F (36.8 C) 98.6 F (37 C) 97.9 F (36.6 C)  TempSrc:  Oral Oral Oral  Resp:  19 19 21   Height: 5\' 1"  (1.549 m)     Weight: 125 lb 10.6 oz (57 kg)     SpO2:  96% 96% 95%    Intake/Output Summary (Last 24 hours) at 03/26/15 0838 Last data filed at 03/26/15 0424  Gross per 24 hour  Intake 317.75 ml  Output    550 ml  Net -232.25 ml   Filed Weights   03/25/15 2133  Weight: 125 lb 10.6 oz (57 kg)    PHYSICAL EXAM  General: Pleasant, NAD. Neuro: Alert and oriented X 3. Moves all extremities spontaneously. Psych: Normal affect. HEENT:  Normal  Neck: Supple without bruits or JVD. Lungs:  Resp regular and unlabored, CTA. Heart: RRR no s3, s4, or murmurs. Abdomen: Soft, non-tender, non-distended, BS + x 4.  Extremities: No clubbing, cyanosis or edema. DP/PT/Radials 2+ and equal  bilaterally.  Accessory Clinical Findings  CBC  Recent Labs  03/25/15 1822 03/26/15 0636  WBC 6.2 4.0  HGB 11.7* 11.3*  HCT 35.4* 35.3*  MCV 92.2 94.1  PLT 247 161   Basic Metabolic Panel  Recent Labs  03/25/15 1822 03/26/15 0636  NA 135 137  K 3.9 3.7  CL 102 104  CO2 22 23  GLUCOSE 97 93  BUN 15 14  CREATININE 1.32* 1.23*  CALCIUM 9.1 8.9  MG 1.9  --    Liver Function Tests  Recent Labs  03/25/15 1822  AST 22  ALT 9*  ALKPHOS 84  BILITOT 0.5  PROT 6.8  ALBUMIN 3.5   Cardiac Enzymes  Recent Labs  03/25/15 1822 03/25/15 2334 03/26/15 0636  TROPONINI 0.40* 0.49* 0.42*   Fasting Lipid Panel  Recent Labs  03/26/15 0636  CHOL 218*  HDL 49  LDLCALC 145*  TRIG 122  CHOLHDL 4.4   Thyroid Function Tests  Recent Labs  03/25/15 2334  TSH 3.123    TELE NSR with HR 50s    ECG  NSR with TWI in lead I and AVL unchanged when compare to previous EKG from Oct 2015  Echocardiogram  pending  Radiology/Studies  Dg Chest Port 1 View  03/25/2015   CLINICAL DATA:  Dizziness and left chest pain.  EXAM: PORTABLE CHEST - 1 VIEW  COMPARISON:  Portable chest and chest CT dated 01/13/2015.  FINDINGS: Interval mildly enlarged cardiac silhouette. Interval mild patchy opacity at the left lung base. Mild scoliosis.  IMPRESSION: Mild patchy atelectasis or pneumonia at the left lung base and mild cardiomegaly.   Electronically Signed   By: Claudie Revering M.D.   On: 03/25/2015 18:22    ASSESSMENT AND PLAN   1. Dizziness/possible reported low HR with ectopic atrial rhythm in the ER  2. Prolonged QT interval  3. Recent chest discomfort with abnormal EKG - first troponin 0.40  - originally transferred from [facility] as Code STEMI, however cancelled as she denies any CP. For the past few days she has some sharp stabbing pain under her L rib  - trop 0.40 --> 0.49 --> 0.42  - pending echo. Will discuss with MD regarding ischemic workup, sx atypical, however  serial trop does not have classic ACS trend however cannot explain the degree of elevation trop   4. HTN 5. Hyperlipidemia 6. H/o chronic diastolic CHF, appearing euvolemic on exam 7. Mild renal insufficiency - probable CKD stage III 8. GERD  Signed, Woodward Ku Pager: [phone]  I have personally seen and examined this patient with Almyra Deforest, PA-C. I agree with the assessment and plan as outlined above. She has had chest pain on/off for two weeks. This is with exertion but many times also at rest and with deep breaths. Her troponin is elevated. Will plan cardiac cath today to exclude obstructive CAD. Risks and benefits reviewed with pt. She agrees to proceed.   Jenna Routzahn 03/26/2015 9:23 AM

## 2015-03-27 ENCOUNTER — Encounter (HOSPITAL_COMMUNITY): Payer: Self-pay | Admitting: Cardiology

## 2015-03-27 ENCOUNTER — Inpatient Hospital Stay (HOSPITAL_COMMUNITY): Payer: Medicare Other

## 2015-03-27 DIAGNOSIS — R079 Chest pain, unspecified: Secondary | ICD-10-CM

## 2015-03-27 DIAGNOSIS — N183 Chronic kidney disease, stage 3 (moderate): Secondary | ICD-10-CM

## 2015-03-27 DIAGNOSIS — I214 Non-ST elevation (NSTEMI) myocardial infarction: Principal | ICD-10-CM

## 2015-03-27 LAB — BASIC METABOLIC PANEL
Anion gap: 8 (ref 5–15)
BUN: 7 mg/dL (ref 6–20)
CHLORIDE: 108 mmol/L (ref 101–111)
CO2: 23 mmol/L (ref 22–32)
Calcium: 8.7 mg/dL — ABNORMAL LOW (ref 8.9–10.3)
Creatinine, Ser: 0.96 mg/dL (ref 0.44–1.00)
GFR calc Af Amer: 60 mL/min — ABNORMAL LOW (ref >60)
GFR calc non Af Amer: 52 mL/min — ABNORMAL LOW (ref >60)
Glucose, Bld: 92 mg/dL (ref 65–99)
POTASSIUM: 3.5 mmol/L (ref 3.5–5.1)
Sodium: 139 mmol/L (ref 135–145)

## 2015-03-27 LAB — CBC
HCT: 34.1 % — ABNORMAL LOW (ref 36.0–46.0)
HEMOGLOBIN: 11.1 g/dL — AB (ref 12.0–15.0)
MCH: 29.8 pg (ref 26.0–34.0)
MCHC: 32.6 g/dL (ref 30.0–36.0)
MCV: 91.7 fL (ref 78.0–100.0)
Platelets: 224 10*3/uL (ref 150–400)
RBC: 3.72 MIL/uL — AB (ref 3.87–5.11)
RDW: 14.3 % (ref 11.5–15.5)
WBC: 5.9 10*3/uL (ref 4.0–10.5)

## 2015-03-27 LAB — HEMOGLOBIN A1C
Hgb A1c MFr Bld: 5.7 % — ABNORMAL HIGH (ref 4.8–5.6)
Mean Plasma Glucose: 117 mg/dL

## 2015-03-27 MED ORDER — TICAGRELOR 90 MG PO TABS: 90.0000 mg | ORAL_TABLET | Freq: Two times a day (BID) | ORAL | Status: AC

## 2015-03-27 MED ORDER — ASPIRIN 81 MG PO TBEC: 81.0000 mg | DELAYED_RELEASE_TABLET | Freq: Every day | ORAL | Status: AC

## 2015-03-27 MED ORDER — AMLODIPINE BESYLATE 10 MG PO TABS: 10.0000 mg | ORAL_TABLET | Freq: Every day | ORAL | Status: DC

## 2015-03-27 MED ORDER — LABETALOL HCL 100 MG PO TABS: 100.0000 mg | ORAL_TABLET | Freq: Two times a day (BID) | ORAL | Status: DC

## 2015-03-27 MED ORDER — ATORVASTATIN CALCIUM 40 MG PO TABS
40.0000 mg | ORAL_TABLET | Freq: Every day | ORAL | Status: DC
Start: 2015-03-27 — End: 2015-04-03

## 2015-03-27 MED ORDER — TICAGRELOR 90 MG PO TABS: 90.0000 mg | ORAL_TABLET | Freq: Two times a day (BID) | ORAL | Status: DC

## 2015-03-27 MED ORDER — LABETALOL HCL 100 MG PO TABS
100.0000 mg | ORAL_TABLET | Freq: Two times a day (BID) | ORAL | Status: DC
  Administered 2015-03-27: 100 mg via ORAL
  Filled 2015-03-27 (×3): qty 1

## 2015-03-27 MED ORDER — NITROGLYCERIN 0.4 MG SL SUBL: 0.4000 mg | SUBLINGUAL_TABLET | SUBLINGUAL | Status: AC | PRN

## 2015-03-27 NOTE — Progress Notes (Signed)
*  PRELIMINARY RESULTS* Echocardiogram 2D Echocardiogram has been performed.  Leavy Cella 03/27/2015, 9:04 AM

## 2015-03-27 NOTE — Discharge Instructions (Signed)

## 2015-03-27 NOTE — Care Management Important Message (Signed)
Important Message  Patient Details  Name: Tamara Hart MRN: [id number] Date of Birth: 29-Dec-1924   Medicare Important Message Given:  Yes-second notification given    Delorse Lek 03/27/2015, 3:16 PM

## 2015-03-27 NOTE — Progress Notes (Signed)
CM spoke with pt/daughter(Pam) regarding Brilinta and provided them with Brilinta booklet with 30 day free copay card enclosed. CM explained card usage and pt/daughter stated  Verbally understanding of card usage. [facility] called per CM to comfirm medication is in stock and CM made pt/daughter aware. No other needs identified per CM  @ present time. Whitman Hero RN,BSN,CM [phone]

## 2015-03-27 NOTE — Discharge Summary (Signed)
Discharge Summary   Patient ID: Tamara Hart,  MRN: [id number], DOB/AGE: 1925/05/01 81 y.o.  Admit date: 03/25/2015 Discharge date: 03/27/2015  Primary Care Provider: [facility] Primary Cardiologist: Dr. Harl Bowie  Discharge Diagnoses Principal Problem:   NSTEMI (non-ST elevated myocardial infarction) Active Problems:   Ectopic atrial rhythm   Prolonged Q-T interval on ECG   Chest pain   Essential hypertension   Hyperlipidemia   Chronic diastolic CHF (congestive heart failure)   CKD (chronic kidney disease), stage III   GERD (gastroesophageal reflux disease)   Allergies Allergies  Allergen Reactions  . Namenda [Memantine] Anxiety and Other (See Comments)    Very agitated and anxiety/panic attacks  . Codeine Other (See Comments)    unknown    Procedures  Echocardiogram 03/27/2015 LV EF: 65% -  70%  ------------------------------------------------------------------- Indications:   Chest pain 786.51.  ------------------------------------------------------------------- History:  PMH: CKD, GERD. Dyspnea. Angina pectoris. PMH: Myocardial infarction. Risk factors: Hypertension. Dyslipidemia.  ------------------------------------------------------------------- Study Conclusions  - Left ventricle: The cavity size was normal. There was moderate concentric hypertrophy. Systolic function was vigorous. The estimated ejection fraction was in the range of 65% to 70%. Wall motion was normal; there were no regional wall motion abnormalities. Doppler parameters are consistent with abnormal left ventricular relaxation (grade 1 diastolic dysfunction). Doppler parameters are consistent with elevated ventricular end-diastolic filling pressure. - Aortic valve: Trileaflet; mildly thickened, mildly calcified leaflets. - Mitral valve: Calcified annulus. Mildly thickened leaflets . Valve area by pressure half-time: 1.65 cm^2. - Right ventricle: The  cavity size was normal. Wall thickness was normal. Systolic function was normal.     Cardiac catheterization 03/26/2015 Conclusion     Mid LAD lesion, 75% stenosed.  Dist RCA lesion, 20% stenosed.  Mid Cx lesion, 50% stenosed.  Prox RCA lesion, 95% stenosed. There is a 0% residual stenosis post intervention.  A drug-eluting stent was placed.  1. 2 vessel obstructive CAD 2. Successful stenting of the proximal RCA (culprit vessel) with a DES.  Recommendation: DAPT for one year. I would treat the moderate disease in the LAD with medical therapy. Anticipate DC in am. Will assess LV function by Echo.       Hospital Course  The patient is a 81 year old female with past medical history of COPD, prior short tobacco history, hypertension, hypothyroidism, hyperlipidemia, h/o remote stroke caught by MRI (pt unaware), mild cognitive impairment, and tremor who presented to [facility] as urgent transfer from [facility]. She was initially called code STEMI but this was later called off due to lack of chest pain and the lack of reciprocal changes on EKG. Despite carrying a diagnosis of cognitive impairment, she is a very good historian. She complained of intermittent chest pain lasting no more than a few minutes at a time for the past few weeks. While at [facility], she also complained of dizziness as well for the past 2 days. She was bradycardic with heart rate of 47 bpm at [facility], however mostly in the 50s. She denies any recent fall or syncope.  After transfer, her beta blocker was held to monitor for heart rate. Overnight, her serial troponin was 0.40-0.49-0.42. Her creatinine was 1.2. Although her symptom was atypical, however there was no good explanation for her elevated troponin. After discussing various options with the patient, she agreed to undergo cardiac catheterization to definitively assess coronary anatomy. She underwent cardiac catheterization on 03/26/2015 which  showed two-vessel disease with 95% prox RCA treated with Synergy DES 2.5 x 24 mm, 75% mid  LAD which will be managed medically.   She was seen on the following morning on a 03/27/2015, at which time she was asymptomatic without further chest discomfort. Given her coronary disease, we will restart her labetalol at a lower dose at 100 mg twice a day on discharge. I will arrange 1 week transition of care follow-up with Dr. Nelly Laurence office. Repeated emphasis has been placed on compliance with DAPT. Echocardiogram was done prior to discharge which showed EF 65-70%, no regional wall motion abnormality, grade 1 diastolic dysfunction.  Of note, during this admission, her lasix was discontinued as she appears to be euvolemic without any sign and symptom of HF. EF normal on echo. She arrived with Cr of 1.3 and her Cr improved to 0.96 after cath. This should be reassessed on followup to see if she needs lasix.   Discharge Vitals Blood pressure 154/56, pulse 74, temperature 97.7 F (36.5 C), temperature source Oral, resp. rate 18, height 5\' 1"  (1.549 m), weight 127 lb 3.3 oz (57.7 kg), SpO2 96 %.  Filed Weights   03/25/15 2133 03/27/15 0011 03/27/15 0500  Weight: 125 lb 10.6 oz (57 kg) 127 lb 3.3 oz (57.7 kg) 127 lb 3.3 oz (57.7 kg)    Labs  CBC  Recent Labs  03/26/15 0636 03/27/15 0332  WBC 4.0 5.9  HGB 11.3* 11.1*  HCT 35.3* 34.1*  MCV 94.1 91.7  PLT 226 785   Basic Metabolic Panel  Recent Labs  03/25/15 1822 03/26/15 0636 03/27/15 0332  NA 135 137 139  K 3.9 3.7 3.5  CL 102 104 108  CO2 22 23 23   GLUCOSE 97 93 92  BUN 15 14 7   CREATININE 1.32* 1.23* 0.96  CALCIUM 9.1 8.9 8.7*  MG 1.9  --   --    Liver Function Tests  Recent Labs  03/25/15 1822  AST 22  ALT 9*  ALKPHOS 84  BILITOT 0.5  PROT 6.8  ALBUMIN 3.5   Cardiac Enzymes  Recent Labs  03/25/15 1822 03/25/15 2334 03/26/15 0636  TROPONINI 0.40* 0.49* 0.42*   Hemoglobin A1C  Recent Labs  03/25/15 2334    HGBA1C 5.7*   Fasting Lipid Panel  Recent Labs  03/26/15 0636  CHOL 218*  HDL 49  LDLCALC 145*  TRIG 122  CHOLHDL 4.4   Thyroid Function Tests  Recent Labs  03/25/15 2334  TSH 3.123    Disposition  Pt is being discharged home today in good condition.  Follow-up Plans & Appointments      Follow-up Information    Follow up with Jory Sims, NP On 04/03/2015.   Specialties:  Nurse Practitioner, Radiology, Cardiology   Why:  @2 :50pm   Contact information:   [place] [phone]       Discharge Medications    Medication List    STOP taking these medications        furosemide 20 MG tablet  Commonly known as:  LASIX      TAKE these medications        ALPRAZolam 0.5 MG tablet  Commonly known as:  XANAX  Take 0.25 mg by mouth at bedtime.     amLODipine 10 MG tablet  Commonly known as:  NORVASC  Take 1 tablet (10 mg total) by mouth daily.     aspirin 81 MG EC tablet  Take 1 tablet (81 mg total) by mouth daily.     atorvastatin 40 MG tablet  Commonly known as:  LIPITOR  Take 1 tablet (40 mg total) by mouth daily at 6 PM.     buPROPion 150 MG 24 hr tablet  Commonly known as:  WELLBUTRIN XL  Take 150 mg by mouth daily.     carbidopa-levodopa 25-100 MG per tablet  Commonly known as:  SINEMET IR  Take 1 tablet by mouth 2 (two) times daily.     clonazePAM 0.5 MG tablet  Commonly known as:  KLONOPIN  1/2 to one tab po bid     FLUoxetine 20 MG capsule  Commonly known as:  PROZAC  Take 20 mg by mouth daily.     HYDROcodone-acetaminophen 7.5-325 MG per tablet  Commonly known as:  NORCO  Take 1 tablet by mouth 2 (two) times daily as needed for moderate pain.     labetalol 100 MG tablet  Commonly known as:  NORMODYNE  Take 1 tablet (100 mg total) by mouth 2 (two) times daily.     levothyroxine 88 MCG tablet  Commonly known as:  SYNTHROID, LEVOTHROID  Take 88 mcg by mouth daily before breakfast.     memantine 10  MG tablet  Commonly known as:  NAMENDA  Take 1 tablet (10 mg total) by mouth 2 (two) times daily.     nitroGLYCERIN 0.4 MG SL tablet  Commonly known as:  NITROSTAT  Place 1 tablet (0.4 mg total) under the tongue every 5 (five) minutes x 3 doses as needed for chest pain.     omeprazole 20 MG capsule  Commonly known as:  PRILOSEC  Take 20 mg by mouth daily.     ticagrelor 90 MG Tabs tablet  Commonly known as:  BRILINTA  Take 1 tablet (90 mg total) by mouth 2 (two) times daily.        Duration of Discharge Encounter   Greater than 30 minutes including physician time.  Hilbert Corrigan PA-C Pager: [phone] 03/27/2015, 10:34 AM

## 2015-03-27 NOTE — Progress Notes (Signed)
CARDIAC REHAB PHASE I   PRE:  Rate/Rhythm: 78 SR  BP:  Supine:   Sitting: 154/56  Standing:    SaO2:   MODE:  Ambulation: 420 ft   POST:  Rate/Rhythm: 87 SR  BP:  Supine:   Sitting: 169/72  Standing:    SaO2:  0900-0952 Pt walked 420 ft on RA with rolling walker and minimal asst. Tolerated well. To recliner and call bell after walk. Pt stated she has cane and walker at home to use if needed. Pt has brilinta booklet and I stressed importance of brilinta with stent. Discussed CRP 2 and pt gave permission to refer to [facility] program.    Graylon Good, RN BSN  03/27/2015 9:47 AM

## 2015-03-27 NOTE — Progress Notes (Signed)
Patient Name: Tamara Hart Date of Encounter: 03/27/2015  Primary Cardiologist: Dr. Harl Bowie   Active Problems:   Ectopic atrial rhythm   Prolonged Q-T interval on ECG   Chest pain   Essential hypertension   Hyperlipidemia   Chronic diastolic CHF (congestive heart failure)   CKD (chronic kidney disease), stage III   GERD (gastroesophageal reflux disease)   NSTEMI (non-ST elevated myocardial infarction)    SUBJECTIVE  Denies any discomfort this morning. No SOB.   CURRENT MEDS . ALPRAZolam  0.25 mg Oral QHS  . amLODipine  10 mg Oral Daily  . aspirin EC  81 mg Oral Daily  . atorvastatin  40 mg Oral q1800  . buPROPion  150 mg Oral Daily  . carbidopa-levodopa  1 tablet Oral BID  . FLUoxetine  20 mg Oral Daily  . levothyroxine  88 mcg Oral QAC breakfast  . pantoprazole  40 mg Oral Daily  . sodium chloride  3 mL Intravenous Q12H  . ticagrelor  90 mg Oral BID    OBJECTIVE  Filed Vitals:   03/27/15 0011 03/27/15 0336 03/27/15 0500 03/27/15 0700  BP: 160/47 179/59  142/53  Pulse: 70 75    Temp: 98.1 F (36.7 C) 97.9 F (36.6 C)    TempSrc: Oral Oral    Resp: 21 16  23   Height:      Weight: 127 lb 3.3 oz (57.7 kg)  127 lb 3.3 oz (57.7 kg)   SpO2: 98% 96%  96%    Intake/Output Summary (Last 24 hours) at 03/27/15 0740 Last data filed at 03/26/15 2122  Gross per 24 hour  Intake 548.25 ml  Output    400 ml  Net 148.25 ml   Filed Weights   03/25/15 2133 03/27/15 0011 03/27/15 0500  Weight: 125 lb 10.6 oz (57 kg) 127 lb 3.3 oz (57.7 kg) 127 lb 3.3 oz (57.7 kg)    PHYSICAL EXAM  General: Pleasant, NAD. Neuro: Alert and oriented X 3. Moves all extremities spontaneously. Psych: Normal affect. HEENT:  Normal  Neck: Supple without bruits or JVD. Lungs:  Resp regular and unlabored, CTA. Heart: RRR no s3, s4, or murmurs. R radial cath site stable, with good distal pulse Abdomen: Soft, non-tender, non-distended, BS + x 4.  Extremities: No clubbing, cyanosis or  edema. DP/PT/Radials 2+ and equal bilaterally.  Accessory Clinical Findings  CBC  Recent Labs  03/26/15 0636 03/27/15 0332  WBC 4.0 5.9  HGB 11.3* 11.1*  HCT 35.3* 34.1*  MCV 94.1 91.7  PLT 226 726   Basic Metabolic Panel  Recent Labs  03/25/15 1822 03/26/15 0636 03/27/15 0332  NA 135 137 139  K 3.9 3.7 3.5  CL 102 104 108  CO2 22 23 23   GLUCOSE 97 93 92  BUN 15 14 7   CREATININE 1.32* 1.23* 0.96  CALCIUM 9.1 8.9 8.7*  MG 1.9  --   --    Liver Function Tests  Recent Labs  03/25/15 1822  AST 22  ALT 9*  ALKPHOS 84  BILITOT 0.5  PROT 6.8  ALBUMIN 3.5   Cardiac Enzymes  Recent Labs  03/25/15 1822 03/25/15 2334 03/26/15 0636  TROPONINI 0.40* 0.49* 0.42*   Fasting Lipid Panel  Recent Labs  03/26/15 0636  CHOL 218*  HDL 49  LDLCALC 145*  TRIG 122  CHOLHDL 4.4   Thyroid Function Tests  Recent Labs  03/25/15 2334  TSH 3.123    TELE NSR with HR 60-70s  ECG  NSR with TWI in lead I and AVL unchanged when compare to previous EKG from Oct 2015  Echocardiogram  pending    Radiology/Studies  Dg Chest Port 1 View  03/25/2015   CLINICAL DATA:  Dizziness and left chest pain.  EXAM: PORTABLE CHEST - 1 VIEW  COMPARISON:  Portable chest and chest CT dated 01/13/2015.  FINDINGS: Interval mildly enlarged cardiac silhouette. Interval mild patchy opacity at the left lung base. Mild scoliosis.  IMPRESSION: Mild patchy atelectasis or pneumonia at the left lung base and mild cardiomegaly.   Electronically Signed   By: Claudie Revering M.D.   On: 03/25/2015 18:22    ASSESSMENT AND PLAN   1. Dizziness/possible reported low HR with ectopic atrial rhythm in the ER  - home labetalol stopped as noted to have bradycardia with HR around 47 prior to transfer. Consider restart labetalol at lower dose, 100mg  BID on discharge.   - stable for discharge today.   2. Prolonged QT interval  3. Recent chest discomfort with abnormal EKG - first troponin 0.40  -  originally transferred from [facility] as Code STEMI, however cancelled as she denies any CP. For the past few days she has some sharp stabbing pain under her L rib  - trop 0.40 --> 0.49 --> 0.42  - Cath 03/26/2015 2v dx with 95% prox RCA treated with Synergy DES 2.5 x 24 mm, 75% mid LAD  - pending echo, may get as outpatient   4. HTN 5. Hyperlipidemia 6. H/o chronic diastolic CHF, appearing euvolemic on exam 7. Mild renal insufficiency - probable CKD stage III 8. GERD  Signed, Woodward Ku Pager: [phone] Patient seen and examined and history reviewed. Agree with above findings and plan. Patient still notes some discomfort in left breast radiating down to abdomen. Ecg without acute change. Labs stable with improved creatinine. LV gram not done at cath due to CKD. Will check Echo for LV function. Agree with resuming labetalol at lower dose for HTN and angina. DC on ASA and Brilinta. Also on amlodipine. OK for DC today with follow up with Dr. Harl Bowie.  Damarkus Balis [place], [place] 03/27/2015 8:08 AM

## 2015-03-28 ENCOUNTER — Telehealth: Payer: Self-pay | Admitting: Cardiology

## 2015-03-28 NOTE — Telephone Encounter (Signed)
TCM Phone call .Marland Kitchen Appt on 04/03/15.. At 2:50pm w/ Leonia Reader in [place] .Marland Kitchen Thanks

## 2015-03-29 NOTE — Telephone Encounter (Signed)
Patient contacted regarding discharge from [facility] on 03/27/2015.  Patient understands to follow up with provider Jory Sims on 04/03/2015 at 2:50pm at [facility]. Patient understands discharge instructions? yes  Patient understands medications and regiment? yes Patient understands to bring all medications to this visit? yes   Spoke with patients daughter [name] and she stated patient is doing well, still having some shortness of breath and her weight was stable today.

## 2015-04-03 ENCOUNTER — Encounter: Payer: Self-pay | Admitting: Adult Health

## 2015-04-03 ENCOUNTER — Ambulatory Visit (INDEPENDENT_AMBULATORY_CARE_PROVIDER_SITE_OTHER): Payer: Medicare Other | Admitting: Adult Health

## 2015-04-03 VITALS — BP 128/60 | HR 86 | Ht 61.0 in | Wt 125.0 lb

## 2015-04-03 DIAGNOSIS — I10 Essential (primary) hypertension: Secondary | ICD-10-CM | POA: Diagnosis not present

## 2015-04-03 DIAGNOSIS — I2510 Atherosclerotic heart disease of native coronary artery without angina pectoris: Secondary | ICD-10-CM

## 2015-04-03 MED ORDER — ATORVASTATIN CALCIUM 40 MG PO TABS: 40.0000 mg | ORAL_TABLET | Freq: Every day | ORAL | Status: DC

## 2015-04-03 MED ORDER — AMLODIPINE BESYLATE 10 MG PO TABS
10.0000 mg | ORAL_TABLET | Freq: Every day | ORAL | Status: DC
Start: 2015-04-03 — End: 2015-07-05

## 2015-04-03 NOTE — Patient Instructions (Addendum)
Your physician recommends that you schedule a follow-up appointment in:3 months with Dr. Harl Bowie   Your physician recommends that you continue on your current medications as directed. Please refer to the Current Medication list given to you today.  You have been given Samples of Brilinta today.  Thank you for choosing [facility]!

## 2015-04-03 NOTE — Progress Notes (Signed)
Cardiology Office Note   Date:  04/03/2015   ID:  Tamara Hart, Tamara Hart 02-16-1925, MRN [id number]  PCP:  Jeri Modena  Cardiologist:  Cloria Spring, NP   Chief Complaint  Patient presents with  . Coronary Artery Disease  . Congestive Heart Failure  . Hypertension      History of Present Illness: Tamara Hart is a 81 y.o. female who presents for post hospitalization follow-up after admission for non-ST elevation MI, ectopic atrial rhythm, prolonged QT interval on ECG, hypertension, with history of hyperlipidemia, chronic diastolic heart failure, and chronic kidney disease.the patient underwent cardiac catheterization revealing mid LAD lesion 75% stenosis RCA lesion.  20% stenosis, mid circumflex lesion 50% stenosis, proximal RCA lesion, 95% stenosis.  0% residual stenosis post intervention using drug-eluting stents to the proximal right coronary artery.  Recommended for dual antiplatelet for one year, and ongoing medical therapy.  Echocardiogram was completed on 12/25/2014.  This demonstrated moderate concentric hypertrophy.  Systolic function was vigorous, estimated EF was 65% to 30%.  Wall motion was normal.  Aortic valve was trileaflet mildly thickened, mildly calcified leaflets.  The patient was found to have grade 1 diastolic dysfunction.  Mitral valve calcified annulus.  Mildly thickened leaflets.. Right ventricle cavity size was normal.  During admission, her Lasix was discontinued, as she appeared to be euvolemic without any signs and symptoms of heart failure.  Creatinine was 1.3.  On arrival to the hospital and improved to 0.96 after catheterization.  Followup appointment.  Will assess need to restart Lasix.  Possibly when necessary.  In the interim, she is to continue dual antiplatelet therapy with ticagrelor 90 mg BID and ASA.    She comes today with her daughter, who is her caregiver, and he does most of the talking.  She is tolerating her new medication  well without problems.  She continues to have some mild lower extremity edema, which is likely related to amlodipine.  The patient is taking Brilinta as directed, without any significant bruising or bleeding.  She continues to have GERD symptoms.  She complains of pain in her left ribs just below her left breast, which hurts sometimes when she takes a breath.    Past Medical History  Diagnosis Date  . COPD (chronic obstructive pulmonary disease)   . Mass of breast, right   . Skin neoplasm     "precancers"  . Hypertension   . Vertigo   . Restless legs   . Hypothyroidism   . Esophageal reflux   . Hyperlipidemia   . Hyperhidrosis   . Insomnia   . Depressive disorder   . Vitamin B 12 deficiency   . Allergic rhinitis   . Stroke     Remote infarct seen on head MRI  . Idiopathic thrombocytopenia purpura   . Rosacea   . Broken ankle     "right; no OR"  . Tremors of nervous system   . Gait difficulty   . Mild cognitive impairment     Started on Namenda by neuro 12/2014  . On home oxygen therapy     "I've got it; don't have to use it" (03/25/2015)  . Pneumonia X 2  . DJD (degenerative joint disease)   . Arthritis     "all over me"  . Chronic lower back pain   . Anxiety   . CAD (coronary artery disease)     03/26/2015 NSTEMI cath 95% prox RCA treated with Synergy DES 2.5 x 24 mm, 75% mid  LAD which managed medically    Past Surgical History  Procedure Laterality Date  . Shoulder open rotator cuff repair Right   . Total abdominal hysterectomy  1969  . Tonsillectomy    . Cardiac catheterization N/A 03/26/2015    Procedure: Left Heart Cath and Coronary Angiography;  Surgeon: Peter M Martinique, MD;  Location: [place] [facility];  Service: Cardiovascular;  Laterality: N/A;  . Cardiac catheterization N/A 03/26/2015    Procedure: Coronary Stent Intervention;  Surgeon: Peter M Martinique, MD;  Location: [place] [facility];  Service: Cardiovascular;  Laterality: N/A;     Current Outpatient  Prescriptions  Medication Sig Dispense Refill  . amLODipine (NORVASC) 10 MG tablet Take 1 tablet (10 mg total) by mouth daily. 30 tablet 5  . aspirin EC 81 MG EC tablet Take 1 tablet (81 mg total) by mouth daily.    Marland Kitchen atorvastatin (LIPITOR) 40 MG tablet Take 1 tablet (40 mg total) by mouth daily at 6 PM. 30 tablet 11  . buPROPion (WELLBUTRIN XL) 150 MG 24 hr tablet Take 150 mg by mouth daily.    . carbidopa-levodopa (SINEMET) 25-100 MG per tablet Take 1 tablet by mouth 2 (two) times daily.      Marland Kitchen FLUoxetine (PROZAC) 20 MG capsule Take 20 mg by mouth daily.    Marland Kitchen HYDROcodone-acetaminophen (NORCO) 7.5-325 MG per tablet Take 1 tablet by mouth 2 (two) times daily as needed for moderate pain.    Marland Kitchen labetalol (NORMODYNE) 100 MG tablet Take 1 tablet (100 mg total) by mouth 2 (two) times daily. 60 tablet 5  . levothyroxine (SYNTHROID, LEVOTHROID) 88 MCG tablet Take 88 mcg by mouth daily before breakfast.    . nitroGLYCERIN (NITROSTAT) 0.4 MG SL tablet Place 1 tablet (0.4 mg total) under the tongue every 5 (five) minutes x 3 doses as needed for chest pain. 25 tablet 3  . omeprazole (PRILOSEC) 20 MG capsule Take 20 mg by mouth daily.      . ticagrelor (BRILINTA) 90 MG TABS tablet Take 1 tablet (90 mg total) by mouth 2 (two) times daily. 180 tablet 3  . ALPRAZolam (XANAX) 0.5 MG tablet Take 0.25 mg by mouth at bedtime.     No current facility-administered medications for this visit.    Allergies:   Namenda and Codeine    Social History:  The patient  reports that she quit smoking about 18 years ago. Her smoking use included Cigarettes. She started smoking about 70 years ago. She has a 3 pack-year smoking history. She has never used smokeless tobacco. She reports that she does not drink alcohol or use illicit drugs.   Family History:  The patient's family history includes Dementia in her mother; Heart disease in her father and mother; Stroke in her father.    ROS: .   All other systems are reviewed and  negative.Unless otherwise mentioned in  H&P above.   PHYSICAL EXAM: VS:  BP 128/60 mmHg  Pulse 86  Ht 5\' 1"  (1.549 m)  Wt 125 lb (56.7 kg)  BMI 23.63 kg/m2  SpO2 95% , BMI Body mass index is 23.63 kg/(m^2). GEN: Well nourished, well developed, in no acute distress HEENT: normal Neck: no JVD, carotid bruits, or masses Cardiac: RRR; no murmurs, rubs, or gallops,no edema  Respiratory:  clear to auscultation bilaterally, normal work of breathing GI: soft, nontender, nondistended, + BS MS: no deformity or atrophy Skin: warm and dry, no rash Neuro:  Strength and sensation are intact Psych: euthymic  mood, full affect  Recent Labs: 03/25/2015: ALT 9*; Magnesium 1.9; TSH 3.123 03/27/2015: BUN 7; Creatinine, Ser 0.96; Hemoglobin 11.1*; Platelets 224; Potassium 3.5; Sodium 139    Lipid Panel    Component Value Date/Time   CHOL 218* 03/26/2015 0636   TRIG 122 03/26/2015 0636   HDL 49 03/26/2015 0636   CHOLHDL 4.4 03/26/2015 0636   VLDL 24 03/26/2015 0636   LDLCALC 145* 03/26/2015 0636      Wt Readings from Last 3 Encounters:  04/03/15 125 lb (56.7 kg)  03/27/15 127 lb 3.3 oz (57.7 kg)  01/15/15 132 lb (59.875 kg)      Other studies Reviewed: Additional studies/ records that were reviewed today include: none Review of the above records demonstrates: N/A   ASSESSMENT AND PLAN:  1. CAD: Cardiac catheterization demonstrating mid LAD lesion of 75%, distal RCA lesion 20% stenosis, mid circumflex lesion 50% stenosis, proximal RCA lesion, 95% stenosis, status post drug-eluting stent to the proximal LAD.  She is tolerating her medication regimen well without any complaints.  She is due to start cardiac rehabilitation in a couple of weeks.  She will followup with Dr. Harl Bowie in the [place] office, as that is where they live and they request being followed closer to home.  She is given samples of antiplatelet therapy as the coupon that was provided by the hospital was not accepted by her  pharmacy.continue risk modification, medication regimen, with, statin.  2. Hypertension: Blood pressure is very well controlled.  She will remain on amlodipine as directed.  I reassured her that some lower extremity edema.  Can happen in the setting of this medication.she continues euvolemic, and I have told her that I do not think this needs to be on Lasix daily.  Her weight has been stable according to her daughters recording.   Multiple questions have been answered concerning her catheterization, prognosis, and medication regimen.  I have spoken with the daughter at length.   Current medicines are reviewed at length with the patient today.    Labs/ tests ordered today include: None No orders of the defined types were placed in this encounter.     Disposition:   FU with 3 months.  Signed, Jory Sims, NP  04/03/2015 2:44 PM    [facility] [address] [place], [place] [address] Phone: [phone]; Fax: [phone]

## 2015-04-03 NOTE — Progress Notes (Deleted)
Name: Tamara Hart    DOB: 09/25/1924  Age: 81 y.o.  MR#: [id number]       PCP:  Jeri Modena      Insurance: Payor: MEDICARE / Plan: MEDICARE PART A AND B / Product Type: *No Product type* /   CC:    Chief Complaint  Patient presents with  . Coronary Artery Disease  . Congestive Heart Failure  . Hypertension    VS Filed Vitals:   04/03/15 1438  BP: 128/60  Pulse: 86  Height: 5\' 1"  (1.549 m)  Weight: 125 lb (56.7 kg)  SpO2: 95%    Weights Current Weight  04/03/15 125 lb (56.7 kg)  03/27/15 127 lb 3.3 oz (57.7 kg)  01/15/15 132 lb (59.875 kg)    Blood Pressure  BP Readings from Last 3 Encounters:  04/03/15 128/60  03/27/15 154/56  01/15/15 136/73     Admit date:  (Not on file) Last encounter with RMR:  Visit date not found   Allergy Namenda and Codeine  Current Outpatient Prescriptions  Medication Sig Dispense Refill  . amLODipine (NORVASC) 10 MG tablet Take 1 tablet (10 mg total) by mouth daily. 30 tablet 5  . aspirin EC 81 MG EC tablet Take 1 tablet (81 mg total) by mouth daily.    Marland Kitchen atorvastatin (LIPITOR) 40 MG tablet Take 1 tablet (40 mg total) by mouth daily at 6 PM. 30 tablet 11  . buPROPion (WELLBUTRIN XL) 150 MG 24 hr tablet Take 150 mg by mouth daily.    . carbidopa-levodopa (SINEMET) 25-100 MG per tablet Take 1 tablet by mouth 2 (two) times daily.      Marland Kitchen FLUoxetine (PROZAC) 20 MG capsule Take 20 mg by mouth daily.    Marland Kitchen HYDROcodone-acetaminophen (NORCO) 7.5-325 MG per tablet Take 1 tablet by mouth 2 (two) times daily as needed for moderate pain.    Marland Kitchen labetalol (NORMODYNE) 100 MG tablet Take 1 tablet (100 mg total) by mouth 2 (two) times daily. 60 tablet 5  . levothyroxine (SYNTHROID, LEVOTHROID) 88 MCG tablet Take 88 mcg by mouth daily before breakfast.    . nitroGLYCERIN (NITROSTAT) 0.4 MG SL tablet Place 1 tablet (0.4 mg total) under the tongue every 5 (five) minutes x 3 doses as needed for chest pain. 25 tablet 3  . omeprazole (PRILOSEC) 20  MG capsule Take 20 mg by mouth daily.      . ticagrelor (BRILINTA) 90 MG TABS tablet Take 1 tablet (90 mg total) by mouth 2 (two) times daily. 180 tablet 3  . ALPRAZolam (XANAX) 0.5 MG tablet Take 0.25 mg by mouth at bedtime.     No current facility-administered medications for this visit.    Discontinued Meds:    Medications Discontinued During This Encounter  Medication Reason  . clonazePAM (KLONOPIN) 0.5 MG tablet Error  . memantine (NAMENDA) 10 MG tablet Error    Patient Active Problem List   Diagnosis Date Noted  . NSTEMI (non-ST elevated myocardial infarction)   . Ectopic atrial rhythm 03/25/2015  . Prolonged Q-T interval on ECG 03/25/2015  . Chest pain 03/25/2015  . Essential hypertension 03/25/2015  . Hyperlipidemia 03/25/2015  . Chronic diastolic CHF (congestive heart failure) 03/25/2015  . CKD (chronic kidney disease), stage III 03/25/2015  . GERD (gastroesophageal reflux disease) 03/25/2015  . Vitamin B12 deficiency 01/16/2015  . Dyspnea 04/07/2011    LABS    Component Value Date/Time   NA 139 03/27/2015 0332   NA 137 03/26/2015 0636  NA 135 03/25/2015 1822   K 3.5 03/27/2015 0332   K 3.7 03/26/2015 0636   K 3.9 03/25/2015 1822   CL 108 03/27/2015 0332   CL 104 03/26/2015 0636   CL 102 03/25/2015 1822   CO2 23 03/27/2015 0332   CO2 23 03/26/2015 0636   CO2 22 03/25/2015 1822   GLUCOSE 92 03/27/2015 0332   GLUCOSE 93 03/26/2015 0636   GLUCOSE 97 03/25/2015 1822   BUN 7 03/27/2015 0332   BUN 14 03/26/2015 0636   BUN 15 03/25/2015 1822   CREATININE 0.96 03/27/2015 0332   CREATININE 1.23* 03/26/2015 0636   CREATININE 1.32* 03/25/2015 1822   CALCIUM 8.7* 03/27/2015 0332   CALCIUM 8.9 03/26/2015 0636   CALCIUM 9.1 03/25/2015 1822   GFRNONAA 52* 03/27/2015 0332   GFRNONAA 38* 03/26/2015 0636   GFRNONAA 35* 03/25/2015 1822   GFRAA 60* 03/27/2015 0332   GFRAA 44* 03/26/2015 0636   GFRAA 41* 03/25/2015 1822   CMP     Component Value Date/Time   NA  139 03/27/2015 0332   K 3.5 03/27/2015 0332   CL 108 03/27/2015 0332   CO2 23 03/27/2015 0332   GLUCOSE 92 03/27/2015 0332   BUN 7 03/27/2015 0332   CREATININE 0.96 03/27/2015 0332   CALCIUM 8.7* 03/27/2015 0332   PROT 6.8 03/25/2015 1822   ALBUMIN 3.5 03/25/2015 1822   AST 22 03/25/2015 1822   ALT 9* 03/25/2015 1822   ALKPHOS 84 03/25/2015 1822   BILITOT 0.5 03/25/2015 1822   GFRNONAA 52* 03/27/2015 0332   GFRAA 60* 03/27/2015 0332       Component Value Date/Time   WBC 5.9 03/27/2015 0332   WBC 4.0 03/26/2015 0636   WBC 6.2 03/25/2015 1822   HGB 11.1* 03/27/2015 0332   HGB 11.3* 03/26/2015 0636   HGB 11.7* 03/25/2015 1822   HCT 34.1* 03/27/2015 0332   HCT 35.3* 03/26/2015 0636   HCT 35.4* 03/25/2015 1822   MCV 91.7 03/27/2015 0332   MCV 94.1 03/26/2015 0636   MCV 92.2 03/25/2015 1822    Lipid Panel     Component Value Date/Time   CHOL 218* 03/26/2015 0636   TRIG 122 03/26/2015 0636   HDL 49 03/26/2015 0636   CHOLHDL 4.4 03/26/2015 0636   VLDL 24 03/26/2015 0636   LDLCALC 145* 03/26/2015 0636    ABG    Component Value Date/Time   TCO2 26 09/20/2008 0910     Lab Results  Component Value Date   TSH 3.123 03/25/2015   BNP (last 3 results) No results for input(s): BNP in the last 8760 hours.  ProBNP (last 3 results) No results for input(s): PROBNP in the last 8760 hours.  Cardiac Panel (last 3 results) No results for input(s): CKTOTAL, CKMB, TROPONINI, RELINDX in the last 72 hours.  Iron/TIBC/Ferritin/ %Sat No results found for: IRON, TIBC, FERRITIN, IRONPCTSAT   EKG Orders placed or performed during the hospital encounter of 03/25/15  . EKG 12-Lead  . EKG 12-Lead  . EKG 12-Lead  . EKG 12-Lead  . EKG  . EKG 12-Lead  . EKG 12-Lead  . EKG 12-Lead  . EKG 12-Lead     Prior Assessment and Plan Problem List as of 04/03/2015      Cardiovascular and Mediastinum   Prolonged Q-T interval on ECG   Essential hypertension   Chronic diastolic CHF  (congestive heart failure)   NSTEMI (non-ST elevated myocardial infarction)     Digestive   Vitamin B12 deficiency  GERD (gastroesophageal reflux disease)     Genitourinary   CKD (chronic kidney disease), stage III     Other   Dyspnea   Last Assessment & Plan 05/14/2011 Office Visit Written 05/14/2011  2:22 PM by Chesley Mires, MD    She has extensive prior history of smoking.  PFT from 2011 showed diffusion defect.  Her symptoms are suggestive of obstructive lung disease.    She has improvement after addition of spiriva to advair.  Advised her to continue with advair, but try using spiriva every other day.  She is to call if her breathing gets worse.  She already had her flu shot this season.        Ectopic atrial rhythm   Chest pain   Hyperlipidemia       Imaging: Dg Chest Port 1 View  03/25/2015   CLINICAL DATA:  Dizziness and left chest pain.  EXAM: PORTABLE CHEST - 1 VIEW  COMPARISON:  Portable chest and chest CT dated 01/13/2015.  FINDINGS: Interval mildly enlarged cardiac silhouette. Interval mild patchy opacity at the left lung base. Mild scoliosis.  IMPRESSION: Mild patchy atelectasis or pneumonia at the left lung base and mild cardiomegaly.   Electronically Signed   By: Claudie Revering M.D.   On: 03/25/2015 18:22

## 2015-04-18 ENCOUNTER — Encounter (HOSPITAL_COMMUNITY)
Admission: RE | Admit: 2015-04-18 | Discharge: 2015-04-18 | Disposition: A | Discharge status: Home / Self Care | Payer: Medicare Other | Source: Ambulatory Visit | Attending: Cardiology | Admitting: Cardiology

## 2015-04-18 VITALS — BP 128/68 | HR 81 | Ht 61.0 in | Wt 126.3 lb

## 2015-04-18 DIAGNOSIS — Z9889 Other specified postprocedural states: Secondary | ICD-10-CM | POA: Diagnosis not present

## 2015-04-18 DIAGNOSIS — I2510 Atherosclerotic heart disease of native coronary artery without angina pectoris: Secondary | ICD-10-CM | POA: Diagnosis not present

## 2015-04-18 DIAGNOSIS — I214 Non-ST elevation (NSTEMI) myocardial infarction: Secondary | ICD-10-CM

## 2015-04-18 DIAGNOSIS — I252 Old myocardial infarction: Secondary | ICD-10-CM | POA: Insufficient documentation

## 2015-04-18 DIAGNOSIS — Z955 Presence of coronary angioplasty implant and graft: Secondary | ICD-10-CM

## 2015-04-18 NOTE — Progress Notes (Signed)
Cardiac/Pulmonary Rehab Medication Review by a Pharmacist  Does the patient  feel that his/her medications are working for him/her?  yes  Has the patient been experiencing any side effects to the medications prescribed?  no  Does the patient measure his/her own blood pressure or blood glucose at home?  no   Does the patient have any problems obtaining medications due to transportation or finances?   no  Understanding of regimen: good Understanding of indications: good Potential of compliance: excellent  Questions asked to Determine Patient Understanding of Medication Regimen:  1. What is the name of the medication?  2. What is the medication used for?  3. When should it be taken?  4. How much should be taken?  5. How will you take it?  6. What side effects should you report?  Understanding Defined as: Excellent: All questions above are correct Good: Questions 1-4 are correct Fair: Questions 1-2 are correct  Poor: 1 or none of the above questions are correct   Pharmacist comments: Pt's family member weighs her every day to monitor for fluid retention. Pt reports no side effects at this time.     Hart Robinsons A 04/18/2015 3:09 PM

## 2015-04-18 NOTE — Progress Notes (Signed)
Patient arrived for 1st visit/orientation/education at 1430. Patient was referred to CR by Dr. Harl Bowie due to NSTEMI I21.4 and Stent Placement Z95.5. During orientation advised patient on arrival and appointment times what to wear, what to do before, during and after exercise. Reviewed attendance and class policy. Talked about inclement weather and class consultation policy. Pt is scheduled to return Cardiac Rehab on 04/22/15 at 3:45pm. Pt was advised to come to class 5 minutes before class starts. He was also given instructions on meeting with the dietician and attending the Family Structure classes. Pt is eager to get started. Patient was able to complete 6 minute walk test. Patient was measured for the equipment. Discussed equipment safety with patient. Took patient pre-anthropometric measurements. Patient finished visit at 1700.

## 2015-04-22 ENCOUNTER — Encounter (HOSPITAL_COMMUNITY)
Admission: RE | Admit: 2015-04-22 | Discharge: 2015-04-22 | Disposition: A | Discharge status: Home / Self Care | Payer: Medicare Other | Source: Ambulatory Visit | Attending: Cardiology | Admitting: Cardiology

## 2015-04-22 DIAGNOSIS — I252 Old myocardial infarction: Secondary | ICD-10-CM | POA: Diagnosis not present

## 2015-04-24 ENCOUNTER — Encounter (HOSPITAL_COMMUNITY)
Admission: RE | Admit: 2015-04-24 | Discharge: 2015-04-24 | Disposition: A | Discharge status: Home / Self Care | Payer: Medicare Other | Source: Ambulatory Visit | Attending: Cardiology | Admitting: Cardiology

## 2015-04-24 DIAGNOSIS — I252 Old myocardial infarction: Secondary | ICD-10-CM | POA: Diagnosis not present

## 2015-04-24 NOTE — Progress Notes (Signed)
Cardiac Rehabilitation Program Outcomes Report   Orientation:  04/18/15 Graduate Date:  tbd Discharge Date:  tbd # of sessions completed: 3  Cardiologist: Branch Family MD:  Deno Lunger Time:  0263  A.  Exercise Program:  Tolerates exercise @ 1.70 METS for 15 minutes and Walk Test Results:  Pre: 2.38  B.  Mental Health:  Good mental attitude  C.  Education/Instruction/Skills  Uses Perceived Exertion Scale and/or Dyspnea Scale  Uses Perceived Exertion Scale and/or Dyspnea Scale  D.  Nutrition/Weight Control/Body Composition:  Adherence to prescribed nutrition program: fair    E.  Blood Lipids    Lab Results  Component Value Date   CHOL 218* 03/26/2015   HDL 49 03/26/2015   LDLCALC 145* 03/26/2015   TRIG 122 03/26/2015   CHOLHDL 4.4 03/26/2015    F.  Lifestyle Changes:  Making positive lifestyle changes  G.  Symptoms noted with exercise:  Asymptomatic  Report Completed By:  Stevphen Rochester RN   Comments:  This is the patients first week progress note for [facility].

## 2015-04-26 ENCOUNTER — Encounter (HOSPITAL_COMMUNITY): Payer: Medicare Other

## 2015-04-29 ENCOUNTER — Encounter (HOSPITAL_COMMUNITY): Payer: Medicare Other

## 2015-04-30 ENCOUNTER — Ambulatory Visit (INDEPENDENT_AMBULATORY_CARE_PROVIDER_SITE_OTHER): Payer: Medicare Other | Admitting: Neurology

## 2015-04-30 ENCOUNTER — Encounter: Payer: Self-pay | Admitting: Neurology

## 2015-04-30 VITALS — BP 142/58 | HR 82 | Ht 61.0 in | Wt 125.0 lb

## 2015-04-30 DIAGNOSIS — I2510 Atherosclerotic heart disease of native coronary artery without angina pectoris: Secondary | ICD-10-CM | POA: Diagnosis not present

## 2015-04-30 DIAGNOSIS — R269 Unspecified abnormalities of gait and mobility: Secondary | ICD-10-CM

## 2015-04-30 DIAGNOSIS — F0390 Unspecified dementia without behavioral disturbance: Secondary | ICD-10-CM

## 2015-04-30 NOTE — Progress Notes (Signed)
Chief Complaint  Patient presents with  . Memory Loss    MMSE 22/30 - 7 animals.  Feels her memory has been stable. She is here with her daughter, Tamara Hart.  . Tremors    Reports tremors are unchanged.  . Extremity Weakness    Her back pain is no worse but she has been falling more frequently.  She has been to the ER for several of her falls, most recently on 04/29/15.  She has also had a heart attack since last seen.       PATIENT: Tamara Hart DOB: 1925/08/14  HISTORICAL  Tamara Hart is 81 years old right-handed female, accompanied by her daughter Tamara Hart, referred by her primary care PA Tamara Hart for evaluation of increased tremor, gait difficulty  She had a history of COPD, depression, was noted to have gradual onset bilateral hands tremor since 2014, gradually getting worse, most noticeable when she writing, holding object with her right hand, she also complains of chronic low back pain, right shoulder pain, has been taking prednisone 10 mg every day over the past 3 months, which does help her pain,  Her brother has significant bilateral hands tremor, started in his early seventies,  Patient also developed gradual onset gait difficulty, midline low back pain, She denied loss sense of smell, no REM sleep disorder, she is sedentary, sitting down most of the time, using cane, wheelchair for longer distance, also has mild memory trouble  She lives with her daughter's family, no longer driving  UPDATE May 27OJ 2016: She is now taking clonazepam 0.5mg  qhs, which has helped her sleep, she also takes over-the-counter sleeping aid. She is taking sentiment 25/100 mg twice a day for her restless leg symptoms, she continue complains of chronic low back pain, mild gait difficulty,  I have reviewed MRI lumbar with patient and her family, this was done at [facility] in August 01 2014, multilevel degenerative disc disease, most severe at L3-4, L4-5, with foraminal stenosis, mild to  moderate, but there was no significant canal stenosis,  MRI of brain in Jan 11 2015, diffuse generalized moderate atrophy, moderate periventricular small vessel disease.  UPDATE Sep 6th 2016: She is with her daughter Tamara Hart, she now living at [facility], she had MI in August 1st 2016,  She has been falling with nose bruise,   She is off manenda agiattionREVIEW OF SYSTEMS: Full 14 system review of systems performed and notable only for as above   ALLERGIES: Allergies  Allergen Reactions  . Namenda [Memantine] Anxiety and Other (See Comments)    Very agitated and anxiety/panic attacks  . Codeine Other (See Comments)    Makes her "sick on her stomach"    HOME MEDICATIONS: Current Outpatient Prescriptions  Medication Sig Dispense Refill  . ALPRAZolam (XANAX) 0.5 MG tablet Take 0.5 mg by mouth 2 (two) times daily as needed.      Marland Kitchen amLODipine (NORVASC) 5 MG tablet Take 1 tablet (5 mg total) by mouth daily. 30 tablet 6  . buPROPion (WELLBUTRIN XL) 150 MG 24 hr tablet Take 150 mg by mouth daily.    . Calcium Carb-Cholecalciferol (CALCIUM 500 +D) 500-400 MG-UNIT TABS 1 tablet twice a day     . carbidopa-levodopa (SINEMET) 25-100 MG per tablet Take 1 tablet by mouth 2 (two) times daily.      Marland Kitchen FLUoxetine (PROZAC) 20 MG capsule Take 20 mg by mouth daily.    . furosemide (LASIX) 20 MG tablet Take 20 mg by  mouth daily.    Marland Kitchen HYDROcodone-acetaminophen (NORCO) 7.5-325 MG per tablet Take 1 tablet by mouth 2 (two) times daily as needed for moderate pain.    Marland Kitchen labetalol (NORMODYNE) 200 MG tablet Take 200 mg by mouth 2 (two) times daily.    Marland Kitchen levothyroxine (SYNTHROID, LEVOTHROID) 88 MCG tablet Take 88 mcg by mouth daily before breakfast.    . omeprazole (PRILOSEC) 20 MG capsule Take 20 mg by mouth daily.      . predniSONE (DELTASONE) 10 MG tablet Take 10 mg by mouth daily with breakfast.    . traZODone (DESYREL) 100 MG tablet Take 100 mg by mouth at bedtime.     No current  facility-administered medications for this visit.    PAST MEDICAL HISTORY: Past Medical History  Diagnosis Date  . COPD (chronic obstructive pulmonary disease)   . Mass of breast, right   . Skin neoplasm     "precancers"  . Hypertension   . Vertigo   . Restless legs   . Hypothyroidism   . Esophageal reflux   . Hyperlipidemia   . Hyperhidrosis   . Insomnia   . Depressive disorder   . Vitamin B 12 deficiency   . Allergic rhinitis   . Stroke     Remote infarct seen on head MRI  . Idiopathic thrombocytopenia purpura   . Rosacea   . Broken ankle     "right; no OR"  . Tremors of nervous system   . Gait difficulty   . Mild cognitive impairment     Started on Namenda by neuro 12/2014  . On home oxygen therapy     "I've got it; don't have to use it" (03/25/2015)  . Pneumonia X 2  . DJD (degenerative joint disease)   . Arthritis     "all over me"  . Chronic lower back pain   . Anxiety   . CAD (coronary artery disease)     03/26/2015 NSTEMI cath 95% prox RCA treated with Synergy DES 2.5 x 24 mm, 75% mid LAD which managed medically  . Heart attack     PAST SURGICAL HISTORY: Past Surgical History  Procedure Laterality Date  . Shoulder open rotator cuff repair Right   . Total abdominal hysterectomy  1969  . Tonsillectomy    . Cardiac catheterization N/A 03/26/2015    Procedure: Left Heart Cath and Coronary Angiography;  Surgeon: Peter M Martinique, MD;  Location: [facility];  Service: Cardiovascular;  Laterality: N/A;  . Cardiac catheterization N/A 03/26/2015    Procedure: Coronary Stent Intervention;  Surgeon: Peter M Martinique, MD;  Location: [facility];  Service: Cardiovascular;  Laterality: N/A;    FAMILY HISTORY: Family History  Problem Relation Age of Onset  . Heart disease Mother   . Heart disease Father   . Dementia Mother   . Stroke Father     SOCIAL HISTORY:  History   Social History  . Marital Status: Married    Spouse Name: N/A  . Number of  Children: 1  . Years of Education: 7th grade   Occupational History  . retired    Social History Main Topics  . Smoking status: Former Smoker -- 1.00 packs/day for 3 years    Types: Cigarettes    Start date: 06/14/1944    Quit date: 08/24/1996  . Smokeless tobacco: Never Used  . Alcohol Use: No  . Drug Use: No  . Sexual Activity: Not on file   Other Topics Concern  .  Not on file   Social History Narrative   Lives at home with daughter.   Right-handed.   Occasional caffeine use.    PHYSICAL EXAM   Filed Vitals:   04/30/15 1421  BP: 142/58  Pulse: 82  Height: 5\' 1"  (1.549 m)  Weight: 125 lb (56.7 kg)    Not recorded      Body mass index is 23.63 kg/(m^2).  PHYSICAL EXAMNIATION:  Gen: NAD, conversant, well nourised, obese, well groomed                     Cardiovascular: Regular rate rhythm, no peripheral edema, warm, nontender. Eyes: Conjunctivae clear without exudates or hemorrhage Neck: Supple, no carotid bruise. Pulmonary: Clear to auscultation bilaterally   NEUROLOGICAL EXAM:  MENTAL STATUS: Speech:    Speech is normal; fluent and spontaneous with normal comprehension.  Cognition:  Mini-Mental Status Examination was 22 out of 30, animal naming 7. She is not oriented to time and place, missed 3 out of 3 recalls  CRANIAL NERVES: CN II: Visual fields are full to confrontation. Fundoscopic exam is normal with sharp discs and no vascular changes. Pupils are 4 mm and briskly reactive to light. CN III, IV, VI: extraocular movement are normal. No ptosis. CN V: Facial sensation is intact to pinprick in all 3 divisions bilaterally. Corneal responses are intact.  CN VII: Face is symmetric with normal eye closure and smile. CN VIII: Hearing is normal to rubbing fingers CN IX, X: Palate elevates symmetrically. Phonation is normal. CN XI: Head turning and shoulder shrug are intact CN XII: Tongue is midline with normal movements and no atrophy.  MOTOR: There is  no pronator drift of out-stretched arms. Muscle bulk and tone are normal. Muscle strength is normal.   REFLEXES: Reflexes are 2+ and symmetric at the biceps, triceps, knees, and ankles. Plantar responses are flexor.  SENSORY: Length dependent decreased Light touch, pinprick to ankle level, position sense, and vibration sense are intact in fingers and toes.  COORDINATION: Rapid alternating movements and fine finger movements are intact. There is no dysmetria on finger-to-nose and heel-knee-shin.  GAIT/STANCE:  need to push up to get up from seated position, cautious, wide-based, mildly unsteady   DIAGNOSTIC DATA (LABS, IMAGING, TESTING) - I reviewed patient records, labs, notes, testing and imaging myself where available.  Lab Results  Component Value Date   WBC 5.9 03/27/2015   HGB 11.1* 03/27/2015   HCT 34.1* 03/27/2015   MCV 91.7 03/27/2015   PLT 224 03/27/2015      Component Value Date/Time   NA 139 03/27/2015 0332   K 3.5 03/27/2015 0332   CL 108 03/27/2015 0332   CO2 23 03/27/2015 0332   GLUCOSE 92 03/27/2015 0332   BUN 7 03/27/2015 0332   CREATININE 0.96 03/27/2015 0332   CALCIUM 8.7* 03/27/2015 0332   PROT 6.8 03/25/2015 1822   ALBUMIN 3.5 03/25/2015 1822   AST 22 03/25/2015 1822   ALT 9* 03/25/2015 1822   ALKPHOS 84 03/25/2015 1822   BILITOT 0.5 03/25/2015 1822   GFRNONAA 52* 03/27/2015 0332   GFRAA 60* 03/27/2015 0332   ASSESSMENT AND PLAN  MORGYN MARUT is a 81 y.o. female  Essential tremor  Gait difficulty, frequent falling  Likely due to combination of aging, deconditioning, periventricular white matter disease, low back pain, also possibility of cervical spondylitic myelopathy   After discussed with patient and daughter, decided to hold off further imaging study at this point  Moderate exercise,   PT   Fall precaution Mild dementia:   Mini-Mental Status Examination is 22  out of 30    Tamara Hart, M.D. Ph.D.  [facility] [address], [place], [place] [address] Ph: [phone] Fax: [phone]

## 2015-05-01 ENCOUNTER — Encounter (HOSPITAL_COMMUNITY): Payer: Medicare Other

## 2015-05-03 ENCOUNTER — Encounter (HOSPITAL_COMMUNITY): Payer: Medicare Other

## 2015-05-06 ENCOUNTER — Encounter (HOSPITAL_COMMUNITY): Payer: Medicare Other

## 2015-05-08 ENCOUNTER — Encounter (HOSPITAL_COMMUNITY): Payer: Medicare Other

## 2015-05-10 ENCOUNTER — Encounter (HOSPITAL_COMMUNITY): Payer: Medicare Other

## 2015-05-13 ENCOUNTER — Encounter (HOSPITAL_COMMUNITY): Payer: Medicare Other

## 2015-05-13 NOTE — Progress Notes (Signed)
Patient is discharged from [facility] today,April 26, 2015 with 2 sessions.  Patient stopped due to being placed in an assisted living facility and having exercise classes there.

## 2015-05-15 ENCOUNTER — Encounter (HOSPITAL_COMMUNITY): Payer: Medicare Other

## 2015-05-17 ENCOUNTER — Encounter (HOSPITAL_COMMUNITY): Payer: Medicare Other

## 2015-05-20 ENCOUNTER — Encounter (HOSPITAL_COMMUNITY): Payer: Medicare Other

## 2015-05-22 ENCOUNTER — Encounter (HOSPITAL_COMMUNITY): Payer: Medicare Other

## 2015-05-24 ENCOUNTER — Encounter (HOSPITAL_COMMUNITY): Payer: Medicare Other

## 2015-05-27 ENCOUNTER — Encounter (HOSPITAL_COMMUNITY): Payer: Medicare Other

## 2015-05-29 ENCOUNTER — Encounter (HOSPITAL_COMMUNITY): Payer: Medicare Other

## 2015-05-31 ENCOUNTER — Encounter (HOSPITAL_COMMUNITY): Payer: Medicare Other

## 2015-06-03 ENCOUNTER — Encounter (HOSPITAL_COMMUNITY): Payer: Medicare Other

## 2015-06-05 ENCOUNTER — Encounter (HOSPITAL_COMMUNITY): Payer: Medicare Other

## 2015-06-07 ENCOUNTER — Encounter (HOSPITAL_COMMUNITY): Payer: Medicare Other

## 2015-06-10 ENCOUNTER — Encounter (HOSPITAL_COMMUNITY): Payer: Medicare Other

## 2015-06-12 ENCOUNTER — Encounter (HOSPITAL_COMMUNITY): Payer: Medicare Other

## 2015-06-14 ENCOUNTER — Encounter (HOSPITAL_COMMUNITY): Payer: Medicare Other

## 2015-06-17 ENCOUNTER — Encounter (HOSPITAL_COMMUNITY): Payer: Medicare Other

## 2015-06-19 ENCOUNTER — Encounter (HOSPITAL_COMMUNITY): Payer: Medicare Other

## 2015-06-21 ENCOUNTER — Encounter (HOSPITAL_COMMUNITY): Payer: Medicare Other

## 2015-06-24 ENCOUNTER — Encounter (HOSPITAL_COMMUNITY): Payer: Medicare Other

## 2015-06-26 ENCOUNTER — Encounter (HOSPITAL_COMMUNITY): Payer: Medicare Other

## 2015-06-28 ENCOUNTER — Encounter (HOSPITAL_COMMUNITY): Payer: Medicare Other

## 2015-07-01 ENCOUNTER — Encounter (HOSPITAL_COMMUNITY): Payer: Medicare Other

## 2015-07-03 ENCOUNTER — Encounter (HOSPITAL_COMMUNITY): Payer: Medicare Other

## 2015-07-05 ENCOUNTER — Ambulatory Visit (INDEPENDENT_AMBULATORY_CARE_PROVIDER_SITE_OTHER): Payer: Medicare Other | Admitting: Cardiology

## 2015-07-05 ENCOUNTER — Encounter: Payer: Self-pay | Admitting: *Deleted

## 2015-07-05 ENCOUNTER — Encounter (HOSPITAL_COMMUNITY): Payer: Medicare Other

## 2015-07-05 ENCOUNTER — Encounter: Payer: Self-pay | Admitting: Cardiology

## 2015-07-05 VITALS — BP 104/64 | HR 64 | Wt 119.0 lb

## 2015-07-05 DIAGNOSIS — I2510 Atherosclerotic heart disease of native coronary artery without angina pectoris: Secondary | ICD-10-CM

## 2015-07-05 DIAGNOSIS — R296 Repeated falls: Secondary | ICD-10-CM | POA: Diagnosis not present

## 2015-07-05 DIAGNOSIS — I10 Essential (primary) hypertension: Secondary | ICD-10-CM | POA: Diagnosis not present

## 2015-07-05 DIAGNOSIS — I5032 Chronic diastolic (congestive) heart failure: Secondary | ICD-10-CM

## 2015-07-05 MED ORDER — FUROSEMIDE 20 MG PO TABS: ORAL_TABLET | ORAL | Status: DC

## 2015-07-05 NOTE — Progress Notes (Signed)
Patient ID: DEEDRE GARTHE, female   DOB: 05-02-1925, 81 y.o.   MRN: [id number]     Clinical Summary Ms. Markley is a 81 y.o.female seen today for follow up of the following medical problems.   1. Chronic diastolic heart failure - denies any recent LE edema. No significant SOB or DOE.   2. HTN - checks bp at home at times, but not often.  - reports recent orthostatic symptoms combined with falls. Severe bruising of her face and a right shoulder injury due to multiple falls.   3. CAD - admit 03/2015 with NSTEMI, received DES to RCA. 75% LAD disease was managed medically.  - 03/2015 echo LVEF [id number], grade I diastolic dysfunction Denies any chest pain. No SOB or DOE.   4. Falls - followed by neurology. Total 3-4 falls since September.  - has noted some low bp's at home.  - reports normal orthostatics at [facility] yesterday - each episode occurred with waking up and walking to bathroom.  - weight down 125 to 119. Weight was 137 lbs 11/2014.  Past Medical History  Diagnosis Date  . COPD (chronic obstructive pulmonary disease)   . Mass of breast, right   . Skin neoplasm     "precancers"  . Hypertension   . Vertigo   . Restless legs   . Hypothyroidism   . Esophageal reflux   . Hyperlipidemia   . Hyperhidrosis   . Insomnia   . Depressive disorder   . Vitamin B 12 deficiency   . Allergic rhinitis   . Stroke     Remote infarct seen on head MRI  . Idiopathic thrombocytopenia purpura   . Rosacea   . Broken ankle     "right; no OR"  . Tremors of nervous system   . Gait difficulty   . Mild cognitive impairment     Started on Namenda by neuro 12/2014  . On home oxygen therapy     "I've got it; don't have to use it" (03/25/2015)  . Pneumonia X 2  . DJD (degenerative joint disease)   . Arthritis     "all over me"  . Chronic lower back pain   . Anxiety   . CAD (coronary artery disease)     03/26/2015 NSTEMI cath 95% prox RCA treated with Synergy DES 2.5 x 24 mm, 75% mid LAD  which managed medically  . Heart attack      Allergies  Allergen Reactions  . Namenda [Memantine] Anxiety and Other (See Comments)    Very agitated and anxiety/panic attacks  . Codeine Other (See Comments)    Makes her "sick on her stomach"     Current Outpatient Prescriptions  Medication Sig Dispense Refill  . ALPRAZolam (XANAX) 1 MG tablet Take 1 mg by mouth at bedtime as needed for sleep.    Marland Kitchen amLODipine (NORVASC) 10 MG tablet Take 1 tablet (10 mg total) by mouth daily. 30 tablet 5  . aspirin EC 81 MG EC tablet Take 1 tablet (81 mg total) by mouth daily.    Marland Kitchen buPROPion (WELLBUTRIN XL) 150 MG 24 hr tablet Take 150 mg by mouth daily.    . carbidopa-levodopa (SINEMET) 25-100 MG per tablet Take 1 tablet by mouth 2 (two) times daily as needed (Restless legs).     Marland Kitchen FLUoxetine (PROZAC) 20 MG capsule Take 20 mg by mouth daily.    . furosemide (LASIX) 20 MG tablet Take 20 mg by mouth daily.    Marland Kitchen HYDROcodone-acetaminophen ([place])  7.5-325 MG per tablet Take 1 tablet by mouth 2 (two) times daily as needed for moderate pain.    Marland Kitchen labetalol (NORMODYNE) 100 MG tablet Take 1 tablet (100 mg total) by mouth 2 (two) times daily. 60 tablet 5  . levothyroxine (SYNTHROID, LEVOTHROID) 88 MCG tablet Take 88 mcg by mouth daily before breakfast.    . nitroGLYCERIN (NITROSTAT) 0.4 MG SL tablet Place 1 tablet (0.4 mg total) under the tongue every 5 (five) minutes x 3 doses as needed for chest pain. 25 tablet 3  . omeprazole (PRILOSEC) 20 MG capsule Take 20 mg by mouth 2 (two) times daily.     . ticagrelor (BRILINTA) 90 MG TABS tablet Take 1 tablet (90 mg total) by mouth 2 (two) times daily. 180 tablet 3   No current facility-administered medications for this visit.     Past Surgical History  Procedure Laterality Date  . Shoulder open rotator cuff repair Right   . Total abdominal hysterectomy  1969  . Tonsillectomy    . Cardiac catheterization N/A 03/26/2015    Procedure: Left Heart Cath and Coronary  Angiography;  Surgeon: Peter M Martinique, MD;  Location: [facility];  Service: Cardiovascular;  Laterality: N/A;  . Cardiac catheterization N/A 03/26/2015    Procedure: Coronary Stent Intervention;  Surgeon: Peter M Martinique, MD;  Location: [facility];  Service: Cardiovascular;  Laterality: N/A;     Allergies  Allergen Reactions  . Namenda [Memantine] Anxiety and Other (See Comments)    Very agitated and anxiety/panic attacks  . Codeine Other (See Comments)    Makes her "sick on her stomach"      Family History  Problem Relation Age of Onset  . Heart disease Mother   . Heart disease Father   . Dementia Mother   . Stroke Father      Social History Ms. Vert reports that she quit smoking about 18 years ago. Her smoking use included Cigarettes. She started smoking about 71 years ago. She has a 3 pack-year smoking history. She has never used smokeless tobacco. Ms. Grahan reports that she does not drink alcohol.   Review of Systems CONSTITUTIONAL: No weight loss, fever, chills, weakness or fatigue.  HEENT: Eyes: No visual loss, blurred vision, double vision or yellow sclerae.No hearing loss, sneezing, congestion, runny nose or sore throat.  SKIN: No rash or itching.  CARDIOVASCULAR: per HPI RESPIRATORY: No shortness of breath, cough or sputum.  GASTROINTESTINAL: No anorexia, nausea, vomiting or diarrhea. No abdominal pain or blood.  GENITOURINARY: No burning on urination, no polyuria NEUROLOGICAL: No headache, dizziness, syncope, paralysis, ataxia, numbness or tingling in the extremities. No change in bowel or bladder control.  MUSCULOSKELETAL: right shoulder pain LYMPHATICS: No enlarged nodes. No history of splenectomy.  PSYCHIATRIC: No history of depression or anxiety.  ENDOCRINOLOGIC: No reports of sweating, cold or heat intolerance. No polyuria or polydipsia.  Marland Kitchen   Physical Examination Filed Vitals:   07/05/15 1005  BP: 104/64  Pulse: 64   Filed Vitals:    07/05/15 1005  Weight: 119 lb (53.978 kg)    Gen: resting comfortably, no acute distress HEENT: no scleral icterus, pupils equal round and reactive, no palptable cervical adenopathy,  CV: RRR, no m/r/g, no jvd Resp: Clear to auscultation bilaterally GI: abdomen is soft, non-tender, non-distended, normal bowel sounds, no hepatosplenomegaly MSK: extremities are warm, no edema.  Skin: warm, no rash Neuro:  no focal deficits Psych: appropriate affect   Diagnostic Studies 01/2014 Echo  LVEF 60-65%, abnormal diastolic function (no grade reported)  09/2013 Lexiscan MPI LVEF 70%, no ischemia  05/2014 CT PE No PE. + coronary atherosclerosis  07/2014 PFTs Technically difficult. Combined obstructive and restrictive disease, obstruction reported as mild.    03/2015 Cath  Mid LAD lesion, 75% stenosed.  Dist RCA lesion, 20% stenosed.  Mid Cx lesion, 50% stenosed.  Prox RCA lesion, 95% stenosed. There is a 0% residual stenosis post intervention.  A drug-eluting stent was placed.  1. 2 vessel obstructive CAD 2. Successful stenting of the proximal RCA (culprit vessel) with a DES.  Recommendation: DAPT for one year. I would treat the moderate disease in the LAD with medical therapy. Anticipate DC in am. Will assess LV function by Echo.   03/2015 Echo Study Conclusions  - Left ventricle: The cavity size was normal. There was moderate concentric hypertrophy. Systolic function was vigorous. The estimated ejection fraction was in the range of 65% to 70%. Wall motion was normal; there were no regional wall motion abnormalities. Doppler parameters are consistent with abnormal left ventricular relaxation (grade 1 diastolic dysfunction). Doppler parameters are consistent with elevated ventricular end-diastolic filling pressure. - Aortic valve: Trileaflet; mildly thickened, mildly calcified leaflets. - Mitral valve: Calcified annulus. Mildly thickened leaflets  . Valve area by pressure half-time: 1.65 cm^2. - Right ventricle: The cavity size was normal. Wall thickness was normal. Systolic function was normal.  Assessment and Plan   1. Chronic diastolic heart failure - appears euvolemic in clinic today, no recent symptoms - with recent falls which sound orthostatic by history will change lasix to every other day. Request pcp note from this week, apparently orthostatics were checked.   2. HTN - at goal. May wean meds if falls continue  3. CAD - no recent symptoms, continue current meds  4. Falls - continue to follow with neuro - by history possibly orthostatic in nature. Orthostatics checked by pcp earlier this week, will request results. Change lasix to every other day       Arnoldo Lenis, M.D.

## 2015-07-05 NOTE — Patient Instructions (Signed)
Your physician recommends that you schedule a follow-up appointment in: 3 MONTHS WITH DR. Saguache  Your physician has recommended you make the following change in your medication:   CHANGE LASIX 20 MG EVERY OTHER DAY  Thank you for choosing [facility]!!

## 2015-07-08 ENCOUNTER — Encounter (HOSPITAL_COMMUNITY): Payer: Medicare Other

## 2015-07-10 ENCOUNTER — Encounter (HOSPITAL_COMMUNITY): Payer: Medicare Other

## 2015-07-12 ENCOUNTER — Encounter (HOSPITAL_COMMUNITY): Payer: Medicare Other

## 2015-08-12 ENCOUNTER — Telehealth: Payer: Self-pay | Admitting: Cardiology

## 2015-08-12 NOTE — Telephone Encounter (Signed)
Would like to know if her mother is allowed to have cataract surgery

## 2015-08-12 NOTE — Telephone Encounter (Signed)
Pt made aware, and will contact dr office to confirm that she should not hold ASA or Brilinta

## 2015-08-12 NOTE — Telephone Encounter (Signed)
Surgery ok as long as they do not stop her aspirin and brillinta.Due to her recent stent she should not stop these medications  Zandra Abts MD

## 2015-08-12 NOTE — Telephone Encounter (Signed)
Pt is having cataract surgery on 09/03/15 under general anesthesia and would like ok from Dr Harl Bowie from cardiac standpoint. Dr. Gabriel Carina is not requiring clearance, but pt was concerned. Will forward

## 2015-09-04 ENCOUNTER — Ambulatory Visit: Payer: Medicare Other | Admitting: Nurse Practitioner

## 2015-10-18 ENCOUNTER — Ambulatory Visit (INDEPENDENT_AMBULATORY_CARE_PROVIDER_SITE_OTHER): Payer: Medicare Other | Admitting: Cardiology

## 2015-10-18 ENCOUNTER — Encounter: Payer: Self-pay | Admitting: Cardiology

## 2015-10-18 VITALS — BP 119/66 | HR 61 | Ht 61.0 in | Wt 113.0 lb

## 2015-10-18 DIAGNOSIS — I10 Essential (primary) hypertension: Secondary | ICD-10-CM

## 2015-10-18 DIAGNOSIS — I5032 Chronic diastolic (congestive) heart failure: Secondary | ICD-10-CM

## 2015-10-18 DIAGNOSIS — I2510 Atherosclerotic heart disease of native coronary artery without angina pectoris: Secondary | ICD-10-CM | POA: Diagnosis not present

## 2015-10-18 DIAGNOSIS — R296 Repeated falls: Secondary | ICD-10-CM

## 2015-10-18 NOTE — Progress Notes (Signed)
Patient ID: Tamara Hart, female   DOB: [birth year], 82 y.o.   MRN: [id number]     Clinical Summary Ms. Gabbard is a 82 y.o.female seen today for follow up of the following medical problems.   1. Chronic diastolic heart failure - denies any recent LE edema, last visit due to recurrent falls we changed her lasix to every other day dosing. No significant SOB or DOE since last visit.   2. HTN - compliant with meds  3. CAD - admit 03/2015 with NSTEMI, received DES to RCA. 75% LAD disease was managed medically.  - 03/2015 echo LVEF [id number], grade I diastolic dysfunction Denies any chest pain since last visit. No SOB or DOE.   4. Falls - followed by neurology. She continues to have fairly frequent falls.     Past Medical History  Diagnosis Date  . COPD (chronic obstructive pulmonary disease) ([place])   . Mass of breast, right   . Skin neoplasm     "precancers"  . Hypertension   . Vertigo   . Restless legs   . Hypothyroidism   . Esophageal reflux   . Hyperlipidemia   . Hyperhidrosis   . Insomnia   . Depressive disorder   . Vitamin B 12 deficiency   . Allergic rhinitis   . Stroke [facility])     Remote infarct seen on head MRI  . Idiopathic thrombocytopenia purpura ([place])   . Rosacea   . Broken ankle     "right; no OR"  . Tremors of nervous system   . Gait difficulty   . Mild cognitive impairment     Started on Namenda by neuro 12/2014  . On home oxygen therapy     "I've got it; don't have to use it" (03/25/2015)  . Pneumonia X 2  . DJD (degenerative joint disease)   . Arthritis     "all over me"  . Chronic lower back pain   . Anxiety   . CAD (coronary artery disease)     03/26/2015 NSTEMI cath 95% prox RCA treated with Synergy DES 2.5 x 24 mm, 75% mid LAD which managed medically  . Heart attack ([place])      Allergies  Allergen Reactions  . Namenda [Memantine] Anxiety and Other (See Comments)    Very agitated and anxiety/panic attacks  . Codeine Other (See Comments)   Makes her "sick on her stomach"     Current Outpatient Prescriptions  Medication Sig Dispense Refill  . ALPRAZolam (XANAX) 1 MG tablet Take 1 mg by mouth at bedtime as needed for sleep.    Marland Kitchen amLODipine (NORVASC) 5 MG tablet Take 5 mg by mouth daily.    Marland Kitchen aspirin EC 81 MG EC tablet Take 1 tablet (81 mg total) by mouth daily.    Marland Kitchen buPROPion (WELLBUTRIN XL) 150 MG 24 hr tablet Take 150 mg by mouth daily.    . carbidopa-levodopa (SINEMET) 25-100 MG per tablet Take 1 tablet by mouth 2 (two) times daily as needed (Restless legs).     Marland Kitchen FLUoxetine (PROZAC) 20 MG capsule Take 20 mg by mouth daily.    . furosemide (LASIX) 20 MG tablet Take 1 tab every other day 90 tablet 3  . HYDROcodone-acetaminophen (NORCO) 7.5-325 MG per tablet Take 1 tablet by mouth 2 (two) times daily as needed for moderate pain.    Marland Kitchen labetalol (NORMODYNE) 100 MG tablet Take 1 tablet (100 mg total) by mouth 2 (two) times daily. 60 tablet 5  . levothyroxine (  SYNTHROID, LEVOTHROID) 88 MCG tablet Take 88 mcg by mouth daily before breakfast.    . nitroGLYCERIN (NITROSTAT) 0.4 MG SL tablet Place 1 tablet (0.4 mg total) under the tongue every 5 (five) minutes x 3 doses as needed for chest pain. 25 tablet 3  . omeprazole (PRILOSEC) 20 MG capsule Take 20 mg by mouth 2 (two) times daily.     . ticagrelor (BRILINTA) 90 MG TABS tablet Take 1 tablet (90 mg total) by mouth 2 (two) times daily. 180 tablet 3   No current facility-administered medications for this visit.     Past Surgical History  Procedure Laterality Date  . Shoulder open rotator cuff repair Right   . Total abdominal hysterectomy  1969  . Tonsillectomy    . Cardiac catheterization N/A 03/26/2015    Procedure: Left Heart Cath and Coronary Angiography;  Surgeon: Peter M Martinique, MD;  Location: [place] [facility];  Service: Cardiovascular;  Laterality: N/A;  . Cardiac catheterization N/A 03/26/2015    Procedure: Coronary Stent Intervention;  Surgeon: Peter M Martinique, MD;   Location: [place] [facility];  Service: Cardiovascular;  Laterality: N/A;     Allergies  Allergen Reactions  . Namenda [Memantine] Anxiety and Other (See Comments)    Very agitated and anxiety/panic attacks  . Codeine Other (See Comments)    Makes her "sick on her stomach"      Family History  Problem Relation Age of Onset  . Heart disease Mother   . Heart disease Father   . Dementia Mother   . Stroke Father      Social History Ms. Langreck reports that she quit smoking about 19 years ago. Her smoking use included Cigarettes. She started smoking about 71 years ago. She has a 3 pack-year smoking history. She has never used smokeless tobacco. Ms. Sankar reports that she does not drink alcohol.   Review of Systems CONSTITUTIONAL: No weight loss, fever, chills, weakness or fatigue.  HEENT: Eyes: No visual loss, blurred vision, double vision or yellow sclerae.No hearing loss, sneezing, congestion, runny nose or sore throat.  SKIN: No rash or itching.  CARDIOVASCULAR: per hpi RESPIRATORY: No shortness of breath, cough or sputum.  GASTROINTESTINAL: No anorexia, nausea, vomiting or diarrhea. No abdominal pain or blood.  GENITOURINARY: No burning on urination, no polyuria NEUROLOGICAL: lgihtheadedness MUSCULOSKELETAL: No muscle, back pain, joint pain or stiffness.  LYMPHATICS: No enlarged nodes. No history of splenectomy.  PSYCHIATRIC: No history of depression or anxiety.  ENDOCRINOLOGIC: No reports of sweating, cold or heat intolerance. No polyuria or polydipsia.  Marland Kitchen   Physical Examination Filed Vitals:   10/18/15 1128  BP: 119/66  Pulse: 61   Filed Vitals:   10/18/15 1128  Height: 5\' 1"  (1.549 m)  Weight: 113 lb (51.256 kg)    Gen: resting comfortably, no acute distress HEENT: no scleral icterus, pupils equal round and reactive, no palptable cervical adenopathy,  CV: RRR, no m/r/g, no jvd Resp: Clear to auscultation bilaterally GI: abdomen is soft, non-tender,  non-distended, normal bowel sounds, no hepatosplenomegaly MSK: extremities are warm, no edema.  Skin: warm, no rash Neuro:  no focal deficits Psych: appropriate affect   Diagnostic Studies 01/2014 Echo LVEF 60-65%, abnormal diastolic function (no grade reported)  09/2013 Lexiscan MPI LVEF 70%, no ischemia  05/2014 CT PE No PE. + coronary atherosclerosis  07/2014 PFTs Technically difficult. Combined obstructive and restrictive disease, obstruction reported as mild.    03/2015 Cath  Mid LAD lesion, 75% stenosed.  Dist  RCA lesion, 20% stenosed.  Mid Cx lesion, 50% stenosed.  Prox RCA lesion, 95% stenosed. There is a 0% residual stenosis post intervention.  A drug-eluting stent was placed.  1. 2 vessel obstructive CAD 2. Successful stenting of the proximal RCA (culprit vessel) with a DES.  Recommendation: DAPT for one year. I would treat the moderate disease in the LAD with medical therapy. Anticipate DC in am. Will assess LV function by Echo.   03/2015 Echo Study Conclusions  - Left ventricle: The cavity size was normal. There was moderate concentric hypertrophy. Systolic function was vigorous. The estimated ejection fraction was in the range of 65% to 70%. Wall motion was normal; there were no regional wall motion abnormalities. Doppler parameters are consistent with abnormal left ventricular relaxation (grade 1 diastolic dysfunction). Doppler parameters are consistent with elevated ventricular end-diastolic filling pressure. - Aortic valve: Trileaflet; mildly thickened, mildly calcified leaflets. - Mitral valve: Calcified annulus. Mildly thickened leaflets . Valve area by pressure half-time: 1.65 cm^2. - Right ventricle: The cavity size was normal. Wall thickness was normal. Systolic function was normal.     Assessment and Plan  1. Chronic diastolic heart failure - appears euvolemic in clinic today. She is significantly orthostatic in  clinic today, possible etiology of her falls. We will stop her lasix for now, encouraged increased oral intake.   2. HTN - at goal. Due to orthostatic falls we will stop her norvasc.   3. CAD - no recent symptoms, continue current meds  4. Falls - severely orthostatic in clinic, we will stop her lasix and norvasc. Asked to increase her oral intake. We will also prescribe compression stockings.    F/u 1 month   Arnoldo Lenis, M.D

## 2015-10-18 NOTE — Patient Instructions (Signed)
Your physician recommends that you schedule a follow-up appointment in: [place] DR. BRANCH  Your physician has recommended you make the following change in your medication:   STOP NORVASC  STOP LASIX   WE HAVE GIVEN YOU RX FOR COMPRESSION STOCKINGS.  Thank you for choosing [facility]!!

## 2015-11-18 ENCOUNTER — Encounter: Payer: Self-pay | Admitting: Cardiology

## 2015-11-18 ENCOUNTER — Ambulatory Visit (INDEPENDENT_AMBULATORY_CARE_PROVIDER_SITE_OTHER): Payer: Medicare Other | Admitting: Cardiology

## 2015-11-18 VITALS — BP 126/72 | HR 70 | Ht 61.0 in | Wt 116.0 lb

## 2015-11-18 DIAGNOSIS — I951 Orthostatic hypotension: Secondary | ICD-10-CM

## 2015-11-18 DIAGNOSIS — I2510 Atherosclerotic heart disease of native coronary artery without angina pectoris: Secondary | ICD-10-CM | POA: Diagnosis not present

## 2015-11-18 MED ORDER — FLUDROCORTISONE ACETATE 0.1 MG PO TABS: 0.1000 mg | ORAL_TABLET | Freq: Every day | ORAL | Status: AC

## 2015-11-18 NOTE — Patient Instructions (Signed)
Your physician recommends that you schedule a follow-up appointment in: 3 MONTHS WITH DR. [place]  Your physician has recommended you make the following change in your medication:   STOP LABETALOL   START FLORINEF 0.1 MG DAILY  WE HAVE GIVEN YOU PRINTED RX FOR COMPRESSION STOCKINGS  Thank you for choosing [facility]!!

## 2015-11-18 NOTE — Progress Notes (Addendum)
Patient ID: Tamara Hart, female   DOB: [birth year], 82 y.o.   MRN: [id number]     Clinical Summary Tamara Hart is a 82 y.o.female seen today as a focused follow up on orthostatic hypotension and recurrent falls.    1. Falls - followed by neurology.  - last visit was found to be orthostatic in clinic. We stopped her lasix and norvasc - she continues to have poor oral intake, reports she just doesn't feel like drinking. She did not get her compression stockings that were ordered last visit.    Past Medical History  Diagnosis Date  . COPD (chronic obstructive pulmonary disease) ([place])   . Mass of breast, right   . Skin neoplasm     "precancers"  . Hypertension   . Vertigo   . Restless legs   . Hypothyroidism   . Esophageal reflux   . Hyperlipidemia   . Hyperhidrosis   . Insomnia   . Depressive disorder   . Vitamin B 12 deficiency   . Allergic rhinitis   . Stroke [facility])     Remote infarct seen on head MRI  . Idiopathic thrombocytopenia purpura ([place])   . Rosacea   . Broken ankle     "right; no OR"  . Tremors of nervous system   . Gait difficulty   . Mild cognitive impairment     Started on Namenda by neuro 12/2014  . On home oxygen therapy     "I've got it; don't have to use it" (03/25/2015)  . Pneumonia X 2  . DJD (degenerative joint disease)   . Arthritis     "all over me"  . Chronic lower back pain   . Anxiety   . CAD (coronary artery disease)     03/26/2015 NSTEMI cath 95% prox RCA treated with Synergy DES 2.5 x 24 mm, 75% mid LAD which managed medically  . Heart attack ([place])      Allergies  Allergen Reactions  . Namenda [Memantine] Anxiety and Other (See Comments)    Very agitated and anxiety/panic attacks  . Codeine Other (See Comments)    Makes her "sick on her stomach"     Current Outpatient Prescriptions  Medication Sig Dispense Refill  . ALPRAZolam (XANAX) 1 MG tablet Take 1 mg by mouth at bedtime as needed for sleep.    Marland Kitchen aspirin EC 81 MG EC  tablet Take 1 tablet (81 mg total) by mouth daily.    Marland Kitchen buPROPion (WELLBUTRIN XL) 150 MG 24 hr tablet Take 150 mg by mouth daily.    . carbidopa-levodopa (SINEMET) 25-100 MG per tablet Take 1 tablet by mouth 2 (two) times daily as needed (Restless legs).     Marland Kitchen FLUoxetine (PROZAC) 20 MG capsule Take 20 mg by mouth daily.    Marland Kitchen HYDROcodone-acetaminophen (NORCO) 7.5-325 MG per tablet Take 1 tablet by mouth 2 (two) times daily as needed for moderate pain.    Marland Kitchen labetalol (NORMODYNE) 100 MG tablet Take 1 tablet (100 mg total) by mouth 2 (two) times daily. 60 tablet 5  . levothyroxine (SYNTHROID, LEVOTHROID) 88 MCG tablet Take 88 mcg by mouth daily before breakfast.    . nitroGLYCERIN (NITROSTAT) 0.4 MG SL tablet Place 1 tablet (0.4 mg total) under the tongue every 5 (five) minutes x 3 doses as needed for chest pain. 25 tablet 3  . omeprazole (PRILOSEC) 20 MG capsule Take 20 mg by mouth 2 (two) times daily.     Vladimir Faster Glycol-Propyl Glycol (SYSTANE  ULTRA OP) Apply 1 drop to eye 3 (three) times daily.    . ticagrelor (BRILINTA) 90 MG TABS tablet Take 1 tablet (90 mg total) by mouth 2 (two) times daily. 180 tablet 3   No current facility-administered medications for this visit.     Past Surgical History  Procedure Laterality Date  . Shoulder open rotator cuff repair Right   . Total abdominal hysterectomy  1969  . Tonsillectomy    . Cardiac catheterization N/A 03/26/2015    Procedure: Left Heart Cath and Coronary Angiography;  Surgeon: Peter M Martinique, MD;  Location: [place] [facility];  Service: Cardiovascular;  Laterality: N/A;  . Cardiac catheterization N/A 03/26/2015    Procedure: Coronary Stent Intervention;  Surgeon: Peter M Martinique, MD;  Location: [place] [facility];  Service: Cardiovascular;  Laterality: N/A;     Allergies  Allergen Reactions  . Namenda [Memantine] Anxiety and Other (See Comments)    Very agitated and anxiety/panic attacks  . Codeine Other (See Comments)    Makes her  "sick on her stomach"      Family History  Problem Relation Age of Onset  . Heart disease Mother   . Heart disease Father   . Dementia Mother   . Stroke Father      Social History Tamara Hart reports that she quit smoking about 19 years ago. Her smoking use included Cigarettes. She started smoking about 71 years ago. She has a 3 pack-year smoking history. She has never used smokeless tobacco. Tamara Hart reports that she does not drink alcohol.   Review of Systems CONSTITUTIONAL: No weight loss, fever, chills, weakness or fatigue.  HEENT: Eyes: No visual loss, blurred vision, double vision or yellow sclerae.No hearing loss, sneezing, congestion, runny nose or sore throat.  SKIN: No rash or itching.  CARDIOVASCULAR: no chest pain, no palpitations.  RESPIRATORY: No shortness of breath, cough or sputum.  GASTROINTESTINAL: No anorexia, nausea, vomiting or diarrhea. No abdominal pain or blood.  GENITOURINARY: No burning on urination, no polyuria NEUROLOGICAL: dizziness MUSCULOSKELETAL: No muscle, back pain, joint pain or stiffness.  LYMPHATICS: No enlarged nodes. No history of splenectomy.  PSYCHIATRIC: No history of depression or anxiety.  ENDOCRINOLOGIC: No reports of sweating, cold or heat intolerance. No polyuria or polydipsia.  Marland Kitchen   Physical Examination Filed Vitals:   11/18/15 1154 11/18/15 1157  BP: 125/64 126/72  Pulse: 71 70   Filed Vitals:   11/18/15 1137  Height: 5\' 1"  (1.549 m)  Weight: 116 lb (52.617 kg)    Gen: resting comfortably, no acute distress HEENT: no scleral icterus, pupils equal round and reactive, no palptable cervical adenopathy,  CV: RRR, no m/r/g, no jvd Resp: Clear to auscultation bilaterally GI: abdomen is soft, non-tender, non-distended, normal bowel sounds, no hepatosplenomegaly MSK: extremities are warm, no edema.  Skin: warm, no rash Neuro:  no focal deficits Psych: appropriate affect   Diagnostic Studies 01/2014 Echo LVEF  60-65%, abnormal diastolic function (no grade reported)  09/2013 Lexiscan MPI LVEF 70%, no ischemia  05/2014 CT PE No PE. + coronary atherosclerosis  07/2014 PFTs Technically difficult. Combined obstructive and restrictive disease, obstruction reported as mild.    03/2015 Cath  Mid LAD lesion, 75% stenosed.  Dist RCA lesion, 20% stenosed.  Mid Cx lesion, 50% stenosed.  Prox RCA lesion, 95% stenosed. There is a 0% residual stenosis post intervention.  A drug-eluting stent was placed.  1. 2 vessel obstructive CAD 2. Successful stenting of the proximal RCA (culprit vessel)  with a DES.  Recommendation: DAPT for one year. I would treat the moderate disease in the LAD with medical therapy. Anticipate DC in am. Will assess LV function by Echo.   03/2015 Echo Study Conclusions  - Left ventricle: The cavity size was normal. There was moderate concentric hypertrophy. Systolic function was vigorous. The estimated ejection fraction was in the range of 65% to 70%. Wall motion was normal; there were no regional wall motion abnormalities. Doppler parameters are consistent with abnormal left ventricular relaxation (grade 1 diastolic dysfunction). Doppler parameters are consistent with elevated ventricular end-diastolic filling pressure. - Aortic valve: Trileaflet; mildly thickened, mildly calcified leaflets. - Mitral valve: Calcified annulus. Mildly thickened leaflets . Valve area by pressure half-time: 1.65 cm^2. - Right ventricle: The cavity size was normal. Wall thickness was normal. Systolic function was normal.        Assessment and Plan  1. Orthostatic hypotension/Falls -remains orthostatic in clinic toady. 2 more recent falls with standing. Difficult to get her to increase her oral hydration, she just does not feel like drinking usually - will stop labetolol, reorder compression stockings, and start florinef 0.1mg  daily - encouraged to contact our  office if symptoms continue, she does not have to wait till next follow up to report symptoms  2. CAD - she needs oral surgery, would be reasonable to stop her brillinta now that she is >61months out from her stent if needed. She will f/u with her dentist and let us know. She has quite a bit of difficultly eating and is losing weight.      Arnoldo Lenis, M.D.

## 2015-11-25 ENCOUNTER — Other Ambulatory Visit: Payer: Self-pay | Admitting: Cardiology

## 2016-01-23 DEATH — deceased

## 2016-02-20 ENCOUNTER — Ambulatory Visit: Payer: Self-pay | Admitting: Cardiology

## 2016-12-07 IMAGING — CR DG CHEST 1V PORT
1 series · 1 of 1 positions shown · non-contrast
Comparison: Portable chest and chest CT dated 01/13/2015.

CLINICAL DATA: Dizziness and left chest pain.

EXAM:
PORTABLE CHEST - 1 VIEW

[AP]
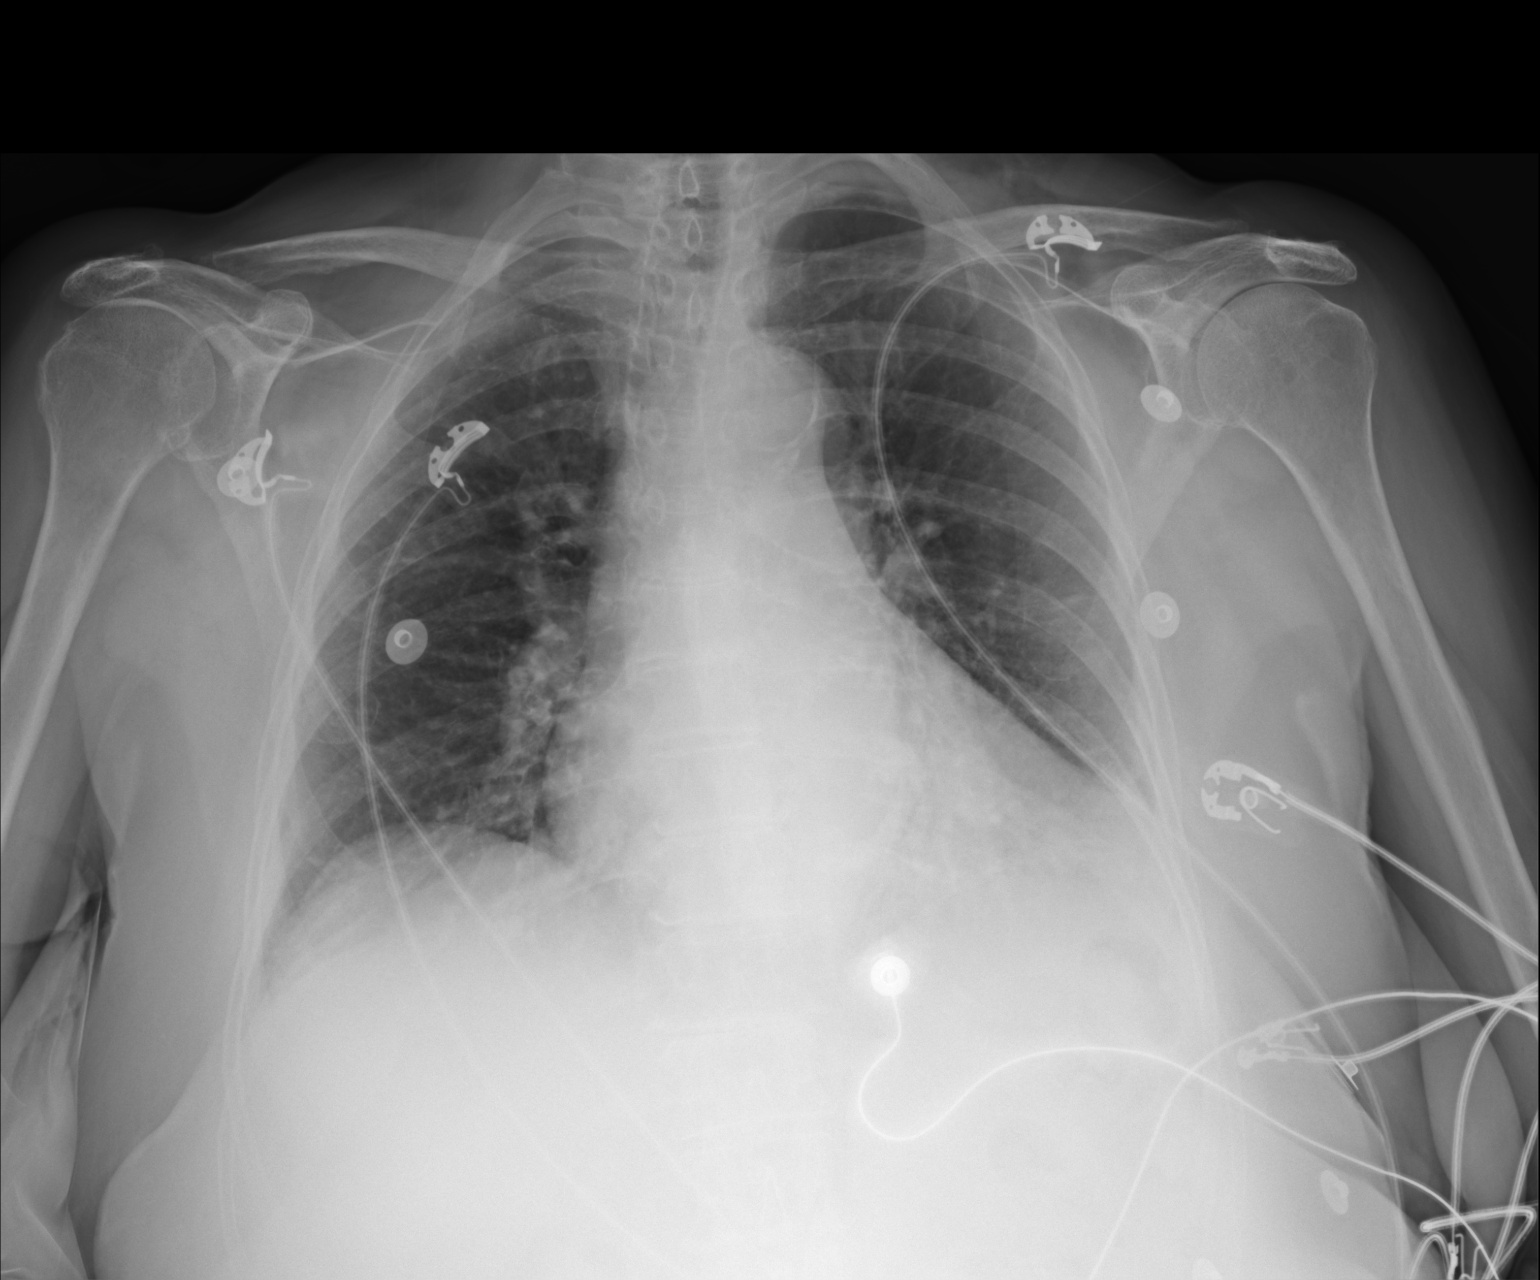

[1 of 1 positions shown; findings below may reference images not displayed]

FINDINGS: Interval mildly enlarged cardiac silhouette. Interval mild patchy
opacity at the left lung base. Mild scoliosis.
IMPRESSION: Mild patchy atelectasis or pneumonia at the left lung base and mild
cardiomegaly.
